# Patient Record
Sex: Female | Born: 1995 | Race: White | Hispanic: No | Marital: Married | Smoking: Former smoker
Health system: Southern US, Community
[De-identification: ages and names within clinical notes are randomized; demographics above are authoritative.]

## PROBLEM LIST (undated history)

## (undated) DIAGNOSIS — F419 Anxiety disorder, unspecified: Secondary | ICD-10-CM

## (undated) DIAGNOSIS — F99 Mental disorder, not otherwise specified: Secondary | ICD-10-CM

## (undated) DIAGNOSIS — J45909 Unspecified asthma, uncomplicated: Secondary | ICD-10-CM

## (undated) DIAGNOSIS — L309 Dermatitis, unspecified: Secondary | ICD-10-CM

## (undated) DIAGNOSIS — O009 Unspecified ectopic pregnancy without intrauterine pregnancy: Secondary | ICD-10-CM

## (undated) HISTORY — DX: Unspecified ectopic pregnancy without intrauterine pregnancy: O00.90

## (undated) HISTORY — DX: Unspecified asthma, uncomplicated: J45.909

## (undated) HISTORY — DX: Anxiety disorder, unspecified: F41.9

## (undated) HISTORY — PX: OTHER SURGICAL HISTORY: SHX169

## (undated) HISTORY — PX: DENTAL SURGERY: SHX609

## (undated) HISTORY — DX: Mental disorder, not otherwise specified: F99

---

## 2018-02-19 ENCOUNTER — Ambulatory Visit: Payer: Self-pay | Admitting: Physician Assistant

## 2018-02-19 ENCOUNTER — Encounter: Payer: Self-pay | Admitting: Physician Assistant

## 2018-02-19 VITALS — BP 111/65 | HR 84 | Temp 98.1°F | Ht 65.25 in | Wt 123.5 lb

## 2018-02-19 DIAGNOSIS — L02411 Cutaneous abscess of right axilla: Secondary | ICD-10-CM

## 2018-02-19 DIAGNOSIS — F419 Anxiety disorder, unspecified: Secondary | ICD-10-CM

## 2018-02-19 DIAGNOSIS — Z130 Encounter for screening for diseases of the blood and blood-forming organs and certain disorders involving the immune mechanism: Secondary | ICD-10-CM

## 2018-02-19 DIAGNOSIS — Z13220 Encounter for screening for lipoid disorders: Secondary | ICD-10-CM

## 2018-02-19 DIAGNOSIS — Z833 Family history of diabetes mellitus: Secondary | ICD-10-CM

## 2018-02-19 DIAGNOSIS — Z131 Encounter for screening for diabetes mellitus: Secondary | ICD-10-CM

## 2018-02-19 DIAGNOSIS — Z7689 Persons encountering health services in other specified circumstances: Secondary | ICD-10-CM

## 2018-02-19 NOTE — Progress Notes (Signed)
BP 111/65 (BP Location: Right Arm, Patient Position: Sitting, Cuff Size: Normal)   Pulse 84   Temp 98.1 F (36.7 C)   Ht 5' 5.25" (1.657 m)   Wt 123 lb 8 oz (56 kg)   SpO2 97%   BMI 20.39 kg/m    Subjective:    Patient ID: Sheila Griffith, female    DOB: [birth year], 22 y.o.   MRN: [id number]  HPI: Sheila Sealmanda Griffith is a 22 y.o. female presenting on 02/19/2018 for New Patient (Initial Visit) (previous patient at [facility] in [place] with Alisa Allwardt. pt was last seen there less than a year ago.) and Cyst (pt states she is currently taking antibiotic and anti-inflammatory [facility] for bumps on her R armpit. pt states bumps are much improved compared to how it was a week and a half ago. pt was told to start planning for surgery. pt has also been treating with peroxide which she states has helped.)   HPI  Chief Complaint  Patient presents with  . New Patient (Initial Visit)    previous patient at [facility] in [place] with Alisa Allwardt. pt was last seen there less than a year ago.  . Cyst    pt states she is currently taking antibiotic and anti-inflammatory [facility] for bumps on her R armpit. pt states bumps are much improved compared to how it was a week and a half ago. pt was told to start planning for surgery. pt has also been treating with peroxide which she states has helped.    Pt says she went to ER twice (at [facility] the first time they cut on the armpit and and prescribed antibiotics.  Her second trip they changed her antibiotics and did not cut again, but told her to prepare for surgery.  She does not know/remember what antibiotics she is taking now nor what she took the first time she went to the ER.   [facility] in about a [facility].   She says she had a PAP there.   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  Denies depression, SI, HI.   Relevant past  medical, surgical, family and social history reviewed and updated as indicated. Interim medical history since our last visit reviewed. Allergies and medications reviewed and updated.   Current Outpatient Medications:  Marland Kitchen.  UNKNOWN TO PATIENT, , Disp: , Rfl:  .  UNKNOWN TO PATIENT, , Disp: , Rfl:    Review of Systems  Constitutional: Positive for appetite change. Negative for chills, diaphoresis, fatigue, fever and unexpected weight change.  HENT: Negative for congestion, dental problem, drooling, ear pain, facial swelling, hearing loss, mouth sores, sneezing, sore throat, trouble swallowing and voice change.   Eyes: Negative for pain, discharge, redness, itching and visual disturbance.  Respiratory: Negative for cough, choking, shortness of breath and wheezing.   Cardiovascular: Negative for chest pain, palpitations and leg swelling.  Gastrointestinal: Negative for abdominal pain, blood in stool, constipation, diarrhea and vomiting.  Endocrine: Negative for cold intolerance, heat intolerance and polydipsia.  Genitourinary: Negative for decreased urine volume, dysuria and hematuria.  Musculoskeletal: Negative for arthralgias, back pain and gait problem.  Skin: Negative for rash.  Allergic/Immunologic: Positive for environmental allergies.  Neurological: Negative for seizures, syncope, light-headedness and headaches.  Hematological: Negative for adenopathy.  Psychiatric/Behavioral: Negative for agitation, dysphoric mood and suicidal ideas. The patient is nervous/anxious.  Per HPI unless specifically indicated above     Objective:    BP 111/65 (BP Location: Right Arm, Patient Position: Sitting, Cuff Size: Normal)   Pulse 84   Temp 98.1 F (36.7 C)   Ht 5' 5.25" (1.657 m)   Wt 123 lb 8 oz (56 kg)   SpO2 97%   BMI 20.39 kg/m   Wt Readings from Last 3 Encounters:  02/19/18 123 lb 8 oz (56 kg)    Physical Exam  Constitutional: She is oriented to person, place, and time. She  appears well-developed and well-nourished.  HENT:  Head: Normocephalic and atraumatic.  Mouth/Throat: Oropharynx is clear and moist. No oropharyngeal exudate.  Eyes: Pupils are equal, round, and reactive to light. Conjunctivae and EOM are normal.  Neck: Neck supple. No thyromegaly present.  Cardiovascular: Normal rate and regular rhythm.  Pulmonary/Chest: Effort normal and breath sounds normal.  Abdominal: Soft. Bowel sounds are normal. She exhibits no mass. There is no hepatosplenomegaly. There is no tenderness.  Musculoskeletal: She exhibits no edema.  Lymphadenopathy:    She has no cervical adenopathy.  Neurological: She is alert and oriented to person, place, and time. Gait normal.  Skin: Skin is warm and dry.  Very large abscess R axilla- covers almost entire surface are of the axilla.  No erythema.  Recent incisions healing.  The area is mildly tender.  The abscess is very nodular and lumpy.  There is no fluctuance needing I&D.   The L axilla is normal to exam  Psychiatric: She has a normal mood and affect. Her behavior is normal.  Vitals reviewed.   No results found for this or any previous visit.    Assessment & Plan:   Encounter Diagnoses  Name Primary?  . Encounter to establish care Yes  . Anxiety   . Abscess of right axilla   . Screening cholesterol level   . Screening for diabetes mellitus   . Family history of diabetes mellitus   . Screening, anemia, deficiency, iron     -Refer to surgeon for possible excision of abscess R axilla -Pt [facility] -Check baseline labs-  -recommended pt contact [facility] for counseling and assistance with her anxiety -pt will follow up in 1 month to review labs.  She will RTO sooner for any worsening of the axilla

## 2018-02-21 ENCOUNTER — Other Ambulatory Visit (HOSPITAL_COMMUNITY)
Admission: RE | Admit: 2018-02-21 | Discharge: 2018-02-21 | Disposition: A | Discharge status: Home / Self Care | Payer: Self-pay | Source: Ambulatory Visit | Attending: Physician Assistant | Admitting: Physician Assistant

## 2018-02-21 DIAGNOSIS — Z130 Encounter for screening for diseases of the blood and blood-forming organs and certain disorders involving the immune mechanism: Secondary | ICD-10-CM | POA: Insufficient documentation

## 2018-02-21 DIAGNOSIS — F419 Anxiety disorder, unspecified: Secondary | ICD-10-CM | POA: Insufficient documentation

## 2018-02-21 DIAGNOSIS — Z13220 Encounter for screening for lipoid disorders: Secondary | ICD-10-CM | POA: Insufficient documentation

## 2018-02-21 DIAGNOSIS — Z131 Encounter for screening for diabetes mellitus: Secondary | ICD-10-CM | POA: Insufficient documentation

## 2018-02-21 DIAGNOSIS — Z833 Family history of diabetes mellitus: Secondary | ICD-10-CM | POA: Insufficient documentation

## 2018-02-21 LAB — LIPID PANEL
CHOL/HDL RATIO: 3.5 ratio
Cholesterol: 145 mg/dL (ref 0–200)
HDL: 41 mg/dL (ref >40)
LDL CALC: 90 mg/dL (ref 0–99)
Triglycerides: 70 mg/dL (ref <150)
VLDL: 14 mg/dL (ref 0–40)

## 2018-02-21 LAB — COMPREHENSIVE METABOLIC PANEL
ALBUMIN: 4.1 g/dL (ref 3.5–5.0)
ALT: 13 U/L (ref 0–44)
AST: 18 U/L (ref 15–41)
Alkaline Phosphatase: 63 U/L (ref 38–126)
Anion gap: 8 (ref 5–15)
BUN: 7 mg/dL (ref 6–20)
CHLORIDE: 105 mmol/L (ref 98–111)
CO2: 27 mmol/L (ref 22–32)
Calcium: 9.7 mg/dL (ref 8.9–10.3)
Creatinine, Ser: 0.69 mg/dL (ref 0.44–1.00)
GFR calc Af Amer: >60 mL/min (ref >60)
GFR calc non Af Amer: >60 mL/min (ref >60)
GLUCOSE: 83 mg/dL (ref 70–99)
POTASSIUM: 4.1 mmol/L (ref 3.5–5.1)
Sodium: 140 mmol/L (ref 135–145)
Total Bilirubin: 0.8 mg/dL (ref 0.3–1.2)
Total Protein: 7.5 g/dL (ref 6.5–8.1)

## 2018-02-21 LAB — CBC
HCT: 41.1 % (ref 36.0–46.0)
Hemoglobin: 13.7 g/dL (ref 12.0–15.0)
MCH: 30.9 pg (ref 26.0–34.0)
MCHC: 33.3 g/dL (ref 30.0–36.0)
MCV: 92.6 fL (ref 78.0–100.0)
Platelets: 287 10*3/uL (ref 150–400)
RBC: 4.44 MIL/uL (ref 3.87–5.11)
RDW: 13.1 % (ref 11.5–15.5)
WBC: 6.1 10*3/uL (ref 4.0–10.5)

## 2018-02-21 LAB — TSH: TSH: 0.958 u[IU]/mL (ref 0.350–4.500)

## 2018-02-21 LAB — HEMOGLOBIN A1C
HEMOGLOBIN A1C: 5.2 % (ref 4.8–5.6)
MEAN PLASMA GLUCOSE: 102.54 mg/dL

## 2018-03-20 ENCOUNTER — Ambulatory Visit: Payer: Self-pay | Admitting: Physician Assistant

## 2018-03-20 ENCOUNTER — Encounter: Payer: Self-pay | Admitting: Physician Assistant

## 2018-03-20 VITALS — BP 98/58 | HR 73 | Temp 97.9°F | Ht 65.25 in | Wt 124.0 lb

## 2018-03-20 DIAGNOSIS — F419 Anxiety disorder, unspecified: Secondary | ICD-10-CM

## 2018-03-20 NOTE — Progress Notes (Signed)
BP (!) 98/58 (BP Location: Left Arm, Patient Position: Sitting, Cuff Size: Normal)   Pulse 73   Temp 97.9 F (36.6 C)   Ht 5' 5.25" (1.657 m)   Wt 124 lb (56.2 kg)   SpO2 99%   BMI 20.48 kg/m    Subjective:    Patient ID: Sheila Griffith, female    DOB: October 27, 1995, 22 y.o.   MRN: [id number]  HPI: Sheila Griffith is a 22 y.o. female presenting on 03/20/2018 for Follow-up   HPI   Pt says she has not heard from surgery but says she doesn't need it any more- her armpit cleared completely.     Pt says she still has anxiety at times.  She denies depression.  She is unaware of any stressors aggravating the anxiety.  Relevant past medical, surgical, family and social history reviewed and updated as indicated. Interim medical history since our last visit reviewed. Allergies and medications reviewed and updated.  No current outpatient medications on file.   Review of Systems  Constitutional: Negative for appetite change, chills, diaphoresis, fatigue, fever and unexpected weight change.  HENT: Positive for sneezing and sore throat. Negative for congestion, dental problem, drooling, ear pain, facial swelling, hearing loss, mouth sores, trouble swallowing and voice change.   Eyes: Positive for visual disturbance. Negative for pain, discharge, redness and itching.  Respiratory: Negative for cough, choking, shortness of breath and wheezing.   Cardiovascular: Negative for chest pain, palpitations and leg swelling.  Gastrointestinal: Negative for abdominal pain, blood in stool, constipation, diarrhea and vomiting.  Endocrine: Negative for cold intolerance, heat intolerance and polydipsia.  Genitourinary: Negative for decreased urine volume, dysuria and hematuria.  Musculoskeletal: Negative for arthralgias, back pain and gait problem.  Skin: Negative for rash.  Allergic/Immunologic: Positive for environmental allergies.  Neurological: Negative for seizures, syncope, light-headedness and  headaches.  Hematological: Negative for adenopathy.  Psychiatric/Behavioral: Negative for agitation, dysphoric mood and suicidal ideas. The patient is nervous/anxious.     Per HPI unless specifically indicated above     Objective:    BP (!) 98/58 (BP Location: Left Arm, Patient Position: Sitting, Cuff Size: Normal)   Pulse 73   Temp 97.9 F (36.6 C)   Ht 5' 5.25" (1.657 m)   Wt 124 lb (56.2 kg)   SpO2 99%   BMI 20.48 kg/m   Wt Readings from Last 3 Encounters:  03/20/18 124 lb (56.2 kg)  02/19/18 123 lb 8 oz (56 kg)    Physical Exam  Constitutional: She is oriented to person, place, and time. She appears well-developed and well-nourished.  HENT:  Head: Normocephalic and atraumatic.  Neck: Neck supple.  Cardiovascular: Normal rate and regular rhythm.  Pulmonary/Chest: Effort normal and breath sounds normal.  Abdominal: Soft. Bowel sounds are normal. She exhibits no mass. There is no hepatosplenomegaly. There is no tenderness.  Musculoskeletal: She exhibits no edema.  Lymphadenopathy:    She has no cervical adenopathy.  Neurological: She is alert and oriented to person, place, and time.  Skin: Skin is warm and dry.  Right axilla abscesses all resolved.  Healing scars present.  No redness.  No tenderness  Psychiatric: She has a normal mood and affect. Her behavior is normal.  Vitals reviewed.   Results for orders placed or performed during the hospital encounter of 02/21/18  Lipid panel  Result Value Ref Range   Cholesterol 145 0 - 200 mg/dL   Triglycerides 70 <782 mg/dL   HDL 41 >95 mg/dL   Total  CHOL/HDL Ratio 3.5 RATIO   VLDL 14 0 - 40 mg/dL   LDL Cholesterol 90 0 - 99 mg/dL  CBC  Result Value Ref Range   WBC 6.1 4.0 - 10.5 K/uL   RBC 4.44 3.87 - 5.11 MIL/uL   Hemoglobin 13.7 12.0 - 15.0 g/dL   HCT 09.4 70.9 - 62.8 %   MCV 92.6 78.0 - 100.0 fL   MCH 30.9 26.0 - 34.0 pg   MCHC 33.3 30.0 - 36.0 g/dL   RDW 36.6 29.4 - 76.5 %   Platelets 287 150 - 400 K/uL  TSH   Result Value Ref Range   TSH 0.958 0.350 - 4.500 uIU/mL  Hemoglobin A1c  Result Value Ref Range   Hgb A1c MFr Bld 5.2 4.8 - 5.6 %   Mean Plasma Glucose 102.54 mg/dL  Comprehensive metabolic panel  Result Value Ref Range   Sodium 140 135 - 145 mmol/L   Potassium 4.1 3.5 - 5.1 mmol/L   Chloride 105 98 - 111 mmol/L   CO2 27 22 - 32 mmol/L   Glucose, Bld 83 70 - 99 mg/dL   BUN 7 6 - 20 mg/dL   Creatinine, Ser 4.65 0.44 - 1.00 mg/dL   Calcium 9.7 8.9 - 03.5 mg/dL   Total Protein 7.5 6.5 - 8.1 g/dL   Albumin 4.1 3.5 - 5.0 g/dL   AST 18 15 - 41 U/L   ALT 13 0 - 44 U/L   Alkaline Phosphatase 63 38 - 126 U/L   Total Bilirubin 0.8 0.3 - 1.2 mg/dL   GFR calc non Af Amer >60 >60 mL/min   GFR calc Af Amer >60 >60 mL/min   Anion gap 8 5 - 15      Assessment & Plan:    Encounter Diagnosis  Name Primary?  Marland Kitchen Anxiety Yes    -reviewed labs with pt -discussed counseling with pt and gave card for [facility] to help with the anxiety.  If she decides at a later time that it is bothering her enough that she would like to try medication to help, she will return to office to discuss.  She says she does not want medication at the present time -pt to follow up in 1 year.  RTO sooner prn

## 2019-02-16 DIAGNOSIS — Z3A08 8 weeks gestation of pregnancy: Secondary | ICD-10-CM | POA: Diagnosis not present

## 2019-02-16 DIAGNOSIS — R110 Nausea: Secondary | ICD-10-CM | POA: Diagnosis not present

## 2019-02-16 DIAGNOSIS — R531 Weakness: Secondary | ICD-10-CM | POA: Diagnosis not present

## 2019-02-16 DIAGNOSIS — Z87891 Personal history of nicotine dependence: Secondary | ICD-10-CM | POA: Diagnosis not present

## 2019-02-16 DIAGNOSIS — O210 Mild hyperemesis gravidarum: Secondary | ICD-10-CM | POA: Diagnosis not present

## 2019-02-16 DIAGNOSIS — O9989 Other specified diseases and conditions complicating pregnancy, childbirth and the puerperium: Secondary | ICD-10-CM | POA: Diagnosis not present

## 2019-02-17 ENCOUNTER — Other Ambulatory Visit: Payer: Self-pay

## 2019-02-17 ENCOUNTER — Encounter: Payer: Self-pay | Admitting: Women's Health

## 2019-02-17 ENCOUNTER — Telehealth (INDEPENDENT_AMBULATORY_CARE_PROVIDER_SITE_OTHER): Payer: Self-pay | Admitting: Women's Health

## 2019-02-17 DIAGNOSIS — Z3201 Encounter for pregnancy test, result positive: Secondary | ICD-10-CM

## 2019-02-17 DIAGNOSIS — Z3491 Encounter for supervision of normal pregnancy, unspecified, first trimester: Secondary | ICD-10-CM

## 2019-02-17 NOTE — Progress Notes (Signed)
   [facility] VIRTUAL GYN VISIT ENCOUNTER NOTE Patient name: Sheila Griffith MRN [id number]  Date of birth: Dec 09, 1995  I connected with patient on 02/17/19 at 10:15 AM EDT by telephone (didn't want to download mychart right now)and verified that I am speaking with the correct person using two identifiers.  Due to COVID-19 recommendations, pt is not currently in the office.    I discussed the limitations, risks, security and privacy concerns of performing an evaluation and management service by telephone and the availability of in person appointments. I also discussed with the patient that there may be a patient responsible charge related to this service. The patient expressed understanding and agreed to proceed.   Chief Complaint:   Possible Pregnancy (2 pos upt at home)  History of Present Illness:   Sheila Griffith is a 23 y.o. [id number] Caucasian female being evaluated today for +HPT. Unsure LMP. Period tracker tells her she's 8wks. Got her to look through app while on phone, says LMP 5/19 which would make her [redacted]w[redacted]d, almost 4wks off from what period tracker is telling her she is. States there is no period documented in June. Taking pnv. Nausea, no vomiting. Declines meds. H/O 2 SABs 6wks & 4-5wks.  Went to [facility] ED yesterday b/c she felt very weak, was a little dehydrated.    No LMP recorded (lmp unknown). Patient is pregnant. 5/19 Review of Systems:   Pertinent items are noted in HPI Denies fever/chills, dizziness, headaches, visual disturbances, fatigue, shortness of breath, chest pain, abdominal pain, vomiting, abnormal vaginal discharge/itching/odor/irritation, problems with periods, bowel movements, urination, or intercourse unless otherwise stated above.  Pertinent History Reviewed:  Reviewed past medical,surgical, social, obstetrical and family history.  Reviewed problem list, medications and allergies. Physical Assessment:  There were no vitals filed for this visit.There is no height or  weight on file to calculate BMI.       Physical Examination:   General:  Alert, oriented and cooperative.   Mental Status: Normal mood and affect perceived. Normal judgment and thought content.  Physical exam deferred due to nature of the encounter  No results found for this or any previous visit (from the past 24 hour(s)).  Assessment & Plan:  1) Pregnant> unsure LMP, will get dating u/s at [facility], next here not available for 2wks  Meds: No orders of the defined types were placed in this encounter.   Orders Placed This Encounter  Procedures  . US OB Comp Less 14 Wks    I discussed the assessment and treatment plan with the patient. The patient was provided an opportunity to ask questions and all were answered. The patient agreed with the plan and demonstrated an understanding of the instructions.   The patient was advised to call back or seek an in-person evaluation/go to the ED if the symptoms worsen or if the condition fails to improve as anticipated.  I provided 15 minutes of non-face-to-face time during this encounter.   No follow-ups on file.  [place], [facility] 02/17/2019 11:22 AM

## 2019-02-19 ENCOUNTER — Telehealth: Payer: Self-pay | Admitting: *Deleted

## 2019-02-19 NOTE — Telephone Encounter (Signed)
Patient made aware U/S is scheduled for 8/14 @ [facility] at 2:30. She is to arrive at 2:15 with a full bladder.

## 2019-02-21 ENCOUNTER — Ambulatory Visit (HOSPITAL_COMMUNITY)
Admission: RE | Admit: 2019-02-21 | Discharge: 2019-02-21 | Disposition: A | Discharge status: Home / Self Care | Payer: Medicaid Other | Source: Ambulatory Visit | Attending: Women's Health | Admitting: Women's Health

## 2019-02-21 ENCOUNTER — Other Ambulatory Visit: Payer: Self-pay

## 2019-02-21 DIAGNOSIS — Z3491 Encounter for supervision of normal pregnancy, unspecified, first trimester: Secondary | ICD-10-CM | POA: Diagnosis not present

## 2019-03-19 ENCOUNTER — Ambulatory Visit: Payer: Self-pay | Admitting: Physician Assistant

## 2019-03-25 ENCOUNTER — Telehealth: Payer: Self-pay | Admitting: Women's Health

## 2019-03-25 NOTE — Telephone Encounter (Signed)
Called patient regarding appointment and the following message was left: ° ° °We have you scheduled for an upcoming appointment at our office. At this time, patients are encouraged to come alone to their visits whenever possible, however, a support person, over age 24, may accompany you to your appointment if assistance is needed for safety or care concerns. Otherwise, support persons should remain outside until the visit is complete.  ° °We ask if you have had any exposure to anyone suspected or confirmed of having COVID-19 or if you are experiencing any of the following, to call and reschedule your appointment: fever, cough, shortness of breath, muscle pain, diarrhea, rash, vomiting, abdominal pain, red eye, weakness, bruising, bleeding, joint pain, or a severe headache.  ° °Please know we will ask you these questions or similar questions when you arrive for your appointment and again it’s how we are keeping everyone safe.   ° °Also,to keep you safe, please use the provided hand sanitizer when you enter the office. We are asking everyone in the office to wear a mask to help prevent the spread of °germs. If you have a mask of your own, please wear it to your appointment, if not, we are happy to provide one for you. ° °Thank you for understanding and your cooperation.  ° ° °[facility] Staff ° ° ° ° ° °

## 2019-03-26 ENCOUNTER — Encounter: Payer: Self-pay | Admitting: Women's Health

## 2019-03-26 ENCOUNTER — Other Ambulatory Visit: Payer: Self-pay

## 2019-03-26 ENCOUNTER — Ambulatory Visit (INDEPENDENT_AMBULATORY_CARE_PROVIDER_SITE_OTHER): Payer: Medicaid Other | Admitting: Women's Health

## 2019-03-26 ENCOUNTER — Ambulatory Visit: Payer: Medicaid Other | Admitting: *Deleted

## 2019-03-26 VITALS — BP 123/83 | HR 85 | Wt 127.0 lb

## 2019-03-26 DIAGNOSIS — Z331 Pregnant state, incidental: Secondary | ICD-10-CM | POA: Diagnosis not present

## 2019-03-26 DIAGNOSIS — Z1379 Encounter for other screening for genetic and chromosomal anomalies: Secondary | ICD-10-CM | POA: Diagnosis not present

## 2019-03-26 DIAGNOSIS — Z3481 Encounter for supervision of other normal pregnancy, first trimester: Secondary | ICD-10-CM | POA: Diagnosis not present

## 2019-03-26 DIAGNOSIS — H01139 Eczematous dermatitis of unspecified eye, unspecified eyelid: Secondary | ICD-10-CM

## 2019-03-26 DIAGNOSIS — Z3A11 11 weeks gestation of pregnancy: Secondary | ICD-10-CM | POA: Diagnosis not present

## 2019-03-26 DIAGNOSIS — Z1389 Encounter for screening for other disorder: Secondary | ICD-10-CM

## 2019-03-26 DIAGNOSIS — Z3490 Encounter for supervision of normal pregnancy, unspecified, unspecified trimester: Secondary | ICD-10-CM | POA: Insufficient documentation

## 2019-03-26 DIAGNOSIS — Z3401 Encounter for supervision of normal first pregnancy, first trimester: Secondary | ICD-10-CM | POA: Diagnosis not present

## 2019-03-26 DIAGNOSIS — Z363 Encounter for antenatal screening for malformations: Secondary | ICD-10-CM

## 2019-03-26 LAB — POCT URINALYSIS DIPSTICK OB
Blood, UA: NEGATIVE
Glucose, UA: NEGATIVE
Ketones, UA: NEGATIVE
Leukocytes, UA: NEGATIVE
Nitrite, UA: NEGATIVE
POC,PROTEIN,UA: NEGATIVE

## 2019-03-26 MED ORDER — HYDROCORTISONE 2 % EX LOTN: TOPICAL_LOTION | CUTANEOUS | 0 refills | Status: DC

## 2019-03-26 MED ORDER — BLOOD PRESSURE MONITOR MISC: 0 refills | Status: DC

## 2019-03-26 NOTE — Patient Instructions (Signed)
Sheila Griffith, I greatly value your feedback.  If you receive a survey following your visit with [place] today, we appreciate you taking the time to fill it out.  Thanks, Sheila Griffith, CNM, [facility]  [place]!!! It is now [facility] ([place], [place] [address]) Entrance located off of [address] parking   Nausea & Vomiting  Have saltine crackers or pretzels by your bed and eat a few bites before you raise your head out of bed in the morning  Eat small frequent meals throughout the day instead of large meals  Drink plenty of fluids throughout the day to stay hydrated, just don't drink a lot of fluids with your meals.  This can make your stomach fill up faster making you feel sick  Do not brush your teeth right after you eat  Products with real ginger are good for nausea, like ginger ale and ginger hard candy Make sure it says made with real ginger!  Sucking on sour candy like lemon heads is also good for nausea  If your prenatal vitamins make you nauseated, take them at night so you will sleep through the nausea  Sea Bands  If you feel like you need medicine for the nausea & vomiting please let us know  If you are unable to keep any fluids or food down please let us know   Constipation  Drink plenty of fluid, preferably water, throughout the day  Eat foods high in fiber such as fruits, vegetables, and grains  Exercise, such as walking, is a good way to keep your bowels regular  Drink warm fluids, especially warm prune juice, or decaf coffee  Eat a 1/2 cup of real oatmeal (not instant), 1/2 cup applesauce, and 1/2-1 cup warm prune juice every day  If needed, you may take Colace (docusate sodium) stool softener once or twice a day to help keep the stool soft.   If you still are having problems with constipation, you may take Miralax once daily as needed to help keep your bowels regular.   Home Blood  Pressure Monitoring for Patients   Your provider has recommended that you check your blood pressure (BP) at least once a week at home. If you do not have a blood pressure cuff at home, one will be provided for you. Contact your provider if you have not received your monitor within 1 week.   Helpful Tips for Accurate Home Blood Pressure Checks  . Don't smoke, exercise, or drink caffeine 30 minutes before checking your BP . Use the restroom before checking your BP (a full bladder can raise your pressure) . Relax in a comfortable upright chair . Feet on the ground . Left arm resting comfortably on a flat surface at the level of your heart . Legs uncrossed . Back supported . Sit quietly and don't talk . Place the cuff on your bare arm . Adjust snuggly, so that only two fingertips can fit between your skin and the top of the cuff . Check 2 readings separated by at least one minute . Keep a log of your BP readings . For a visual, please reference this diagram: http://ccnc.care/bpdiagram  Provider Name: [facility]     Phone: [phone]  Zone 1: ALL CLEAR  Continue to monitor your symptoms:  . BP reading is less than 140 (top number) or less than 90 (bottom number)  . No right upper stomach pain .  No headaches or seeing spots . No feeling nauseated or throwing up . No swelling in face and hands  Zone 2: CAUTION Call your doctor's office for any of the following:  . BP reading is greater than 140 (top number) or greater than 90 (bottom number)  . Stomach pain under your ribs in the middle or right side . Headaches or seeing spots . Feeling nauseated or throwing up . Swelling in face and hands  Zone 3: EMERGENCY  Seek immediate medical care if you have any of the following:  . BP reading is greater than160 (top number) or greater than 110 (bottom number) . Severe headaches not improving with Tylenol . Serious difficulty catching your breath . Any worsening symptoms from Zone  2    First Trimester of Pregnancy The first trimester of pregnancy is from week 1 until the end of week 12 (months 1 through 3). A week after a sperm fertilizes an egg, the egg will implant on the wall of the uterus. This embryo will begin to develop into a baby. Genes from you and your partner are forming the baby. The female genes determine whether the baby is a boy or a girl. At 6-8 weeks, the eyes and face are formed, and the heartbeat can be seen on ultrasound. At the end of 12 weeks, all the baby's organs are formed.  Now that you are pregnant, you will want to do everything you can to have a healthy baby. Two of the most important things are to get good prenatal care and to follow your health care provider's instructions. Prenatal care is all the medical care you receive before the baby's birth. This care will help prevent, find, and treat any problems during the pregnancy and childbirth. BODY CHANGES Your body goes through many changes during pregnancy. The changes vary from woman to woman.   You may gain or lose a couple of pounds at first.  You may feel sick to your stomach (nauseous) and throw up (vomit). If the vomiting is uncontrollable, call your health care provider.  You may tire easily.  You may develop headaches that can be relieved by medicines approved by your health care provider.  You may urinate more often. Painful urination may mean you have a bladder infection.  You may develop heartburn as a result of your pregnancy.  You may develop constipation because certain hormones are causing the muscles that push waste through your intestines to slow down.  You may develop hemorrhoids or swollen, bulging veins (varicose veins).  Your breasts may begin to grow larger and become tender. Your nipples may stick out more, and the tissue that surrounds them (areola) may become darker.  Your gums may bleed and may be sensitive to brushing and flossing.  Dark spots or blotches  (chloasma, mask of pregnancy) may develop on your face. This will likely fade after the baby is born.  Your menstrual periods will stop.  You may have a loss of appetite.  You may develop cravings for certain kinds of food.  You may have changes in your emotions from day to day, such as being excited to be pregnant or being concerned that something may go wrong with the pregnancy and baby.  You may have more vivid and strange dreams.  You may have changes in your hair. These can include thickening of your hair, rapid growth, and changes in texture. Some women also have hair loss during or after pregnancy, or hair that feels dry  or thin. Your hair will most likely return to normal after your baby is born. WHAT TO EXPECT AT YOUR PRENATAL VISITS During a routine prenatal visit:  You will be weighed to make sure you and the baby are growing normally.  Your blood pressure will be taken.  Your abdomen will be measured to track your baby's growth.  The fetal heartbeat will be listened to starting around week 10 or 12 of your pregnancy.  Test results from any previous visits will be discussed. Your health care provider may ask you:  How you are feeling.  If you are feeling the baby move.  If you have had any abnormal symptoms, such as leaking fluid, bleeding, severe headaches, or abdominal cramping.  If you have any questions. Other tests that may be performed during your first trimester include:  Blood tests to find your blood type and to check for the presence of any previous infections. They will also be used to check for low iron levels (anemia) and Rh antibodies. Later in the pregnancy, blood tests for diabetes will be done along with other tests if problems develop.  Urine tests to check for infections, diabetes, or protein in the urine.  An ultrasound to confirm the proper growth and development of the baby.  An amniocentesis to check for possible genetic problems.  Fetal  screens for spina bifida and Down syndrome.  You may need other tests to make sure you and the baby are doing well. HOME CARE INSTRUCTIONS  Medicines  Follow your health care provider's instructions regarding medicine use. Specific medicines may be either safe or unsafe to take during pregnancy.  Take your prenatal vitamins as directed.  If you develop constipation, try taking a stool softener if your health care provider approves. Diet  Eat regular, well-balanced meals. Choose a variety of foods, such as meat or vegetable-based protein, fish, milk and low-fat dairy products, vegetables, fruits, and whole grain breads and cereals. Your health care provider will help you determine the amount of weight gain that is right for you.  Avoid raw meat and uncooked cheese. These carry germs that can cause birth defects in the baby.  Eating four or five small meals rather than three large meals a day may help relieve nausea and vomiting. If you start to feel nauseous, eating a few soda crackers can be helpful. Drinking liquids between meals instead of during meals also seems to help nausea and vomiting.  If you develop constipation, eat more high-fiber foods, such as fresh vegetables or fruit and whole grains. Drink enough fluids to keep your urine clear or pale yellow. Activity and Exercise  Exercise only as directed by your health care provider. Exercising will help you:  Control your weight.  Stay in shape.  Be prepared for labor and delivery.  Experiencing pain or cramping in the lower abdomen or low back is a good sign that you should stop exercising. Check with your health care provider before continuing normal exercises.  Try to avoid standing for long periods of time. Move your legs often if you must stand in one place for a long time.  Avoid heavy lifting.  Wear low-heeled shoes, and practice good posture.  You may continue to have sex unless your health care provider directs you  otherwise. Relief of Pain or Discomfort  Wear a good support bra for breast tenderness.    Take warm sitz baths to soothe any pain or discomfort caused by hemorrhoids. Use hemorrhoid cream if your  health care provider approves.    Rest with your legs elevated if you have leg cramps or low back pain.  If you develop varicose veins in your legs, wear support hose. Elevate your feet for 15 minutes, 3-4 times a day. Limit salt in your diet. Prenatal Care  Schedule your prenatal visits by the twelfth week of pregnancy. They are usually scheduled monthly at first, then more often in the last 2 months before delivery.  Write down your questions. Take them to your prenatal visits.  Keep all your prenatal visits as directed by your health care provider. Safety  Wear your seat belt at all times when driving.  Make a list of emergency phone numbers, including numbers for family, friends, the hospital, and police and fire departments. General Tips  Ask your health care provider for a referral to a local prenatal education class. Begin classes no later than at the beginning of month 6 of your pregnancy.  Ask for help if you have counseling or nutritional needs during pregnancy. Your health care provider can offer advice or refer you to specialists for help with various needs.  Do not use hot tubs, steam rooms, or saunas.  Do not douche or use tampons or scented sanitary pads.  Do not cross your legs for long periods of time.  Avoid cat litter boxes and soil used by cats. These carry germs that can cause birth defects in the baby and possibly loss of the fetus by miscarriage or stillbirth.  Avoid all smoking, herbs, alcohol, and medicines not prescribed by your health care provider. Chemicals in these affect the formation and growth of the baby.  Schedule a dentist appointment. At home, brush your teeth with a soft toothbrush and be gentle when you floss. SEEK MEDICAL CARE IF:   You have  dizziness.  You have mild pelvic cramps, pelvic pressure, or nagging pain in the abdominal area.  You have persistent nausea, vomiting, or diarrhea.  You have a bad smelling vaginal discharge.  You have pain with urination.  You notice increased swelling in your face, hands, legs, or ankles. SEEK IMMEDIATE MEDICAL CARE IF:   You have a fever.  You are leaking fluid from your vagina.  You have spotting or bleeding from your vagina.  You have severe abdominal cramping or pain.  You have rapid weight gain or loss.  You vomit blood or material that looks like coffee grounds.  You are exposed to [place] measles and have never had them.  You are exposed to fifth disease or chickenpox.  You develop a severe headache.  You have shortness of breath.  You have any kind of trauma, such as from a fall or a car accident. Document Released: 06/20/2001 Document Revised: 11/10/2013 Document Reviewed: 05/06/2013 [facility] Patient Information 2015 [place], [place]. This information is not intended to replace advice given to you by your health care provider. Make sure you discuss any questions you have with your health care provider.  Coronavirus (COVID-19) Are you at risk?  Are you at risk for the Coronavirus (COVID-19)?  To be considered HIGH RISK for Coronavirus (COVID-19), you have to meet the following criteria:  . Traveled to [place], [place], [place], [place] or [place]; or in the [place] to [place], [place], [place], or [place]; and have fever, cough, and shortness of breath within the last 2 weeks of travel OR . Been in close contact with a person diagnosed with COVID-19 within the last 2 weeks and  have fever, cough, and shortness of breath . IF YOU DO NOT MEET THESE CRITERIA, YOU ARE CONSIDERED LOW RISK FOR COVID-19.  What to do if you are HIGH RISK for COVID-19?  Marland Kitchen If you are having a medical emergency, call 911. . Seek medical care right away. Before you go to a  doctor's office, urgent care or emergency department, call ahead and tell them about your recent travel, contact with someone diagnosed with COVID-19, and your symptoms. You should receive instructions from your physician's office regarding next steps of care.  . When you arrive at healthcare provider, tell the healthcare staff immediately you have returned from visiting [place], [place], [place], [place] or [place]; or traveled in the [place] to [place], [place], [place], or [place]; in the last two weeks or you have been in close contact with a person diagnosed with COVID-19 in the last 2 weeks.   . Tell the health care staff about your symptoms: fever, cough and shortness of breath. . After you have been seen by a medical provider, you will be either: o Tested for (COVID-19) and discharged home on quarantine except to seek medical care if symptoms worsen, and asked to  - Stay home and avoid contact with others until you get your results (4-5 days)  - Avoid travel on public transportation if possible (such as bus, train, or airplane) or o Sent to the Emergency Department by EMS for evaluation, COVID-19 testing, and possible admission depending on your condition and test results.  What to do if you are LOW RISK for COVID-19?  Reduce your risk of any infection by using the same precautions used for avoiding the common cold or flu:  Marland Kitchen Wash your hands often with soap and warm water for at least 20 seconds.  If soap and water are not readily available, use an alcohol-based hand sanitizer with at least 60% alcohol.  . If coughing or sneezing, cover your mouth and nose by coughing or sneezing into the elbow areas of your shirt or coat, into a tissue or into your sleeve (not your hands). . Avoid shaking hands with others and consider head nods or verbal greetings only. . Avoid touching your eyes, nose, or mouth with unwashed hands.  . Avoid close contact with people who are sick. . Avoid  places or events with large numbers of people in one location, like concerts or sporting events. . Carefully consider travel plans you have or are making. . If you are planning any travel outside or inside the [place], visit the CDC's Travelers' Health webpage for the latest health notices. . If you have some symptoms but not all symptoms, continue to monitor at home and seek medical attention if your symptoms worsen. . If you are having a medical emergency, call 911.   [place] / e-Visit: [email or web address]         [facility]: [facility]: [id number]                   [facility]: [phone]

## 2019-03-26 NOTE — Progress Notes (Signed)
INITIAL OBSTETRICAL VISIT Patient name: Sheila Griffith MRN [id number]  Date of birth: [birth year] Chief Complaint:   Initial Prenatal Visit  History of Present Illness:   Sheila Griffith is a 23 y.o. 453P0020 Caucasian female at 2559w5d by 7wk u/s, with an Estimated Date of Delivery: 10/10/19 being seen today for her initial obstetrical visit.   Her obstetrical history is significant for SAB x 2.   Today she reports bad periorbital eczema that started w/ pregnancy, has tried multiple otc creams w/o relief Anx- no meds, doing well No LMP recorded (lmp unknown). Patient is pregnant. Last pap last year [facility]. Results were: normal Review of Systems:   Pertinent items are noted in HPI Denies cramping/contractions, leakage of fluid, vaginal bleeding, abnormal vaginal discharge w/ itching/odor/irritation, headaches, visual changes, shortness of breath, chest pain, abdominal pain, severe nausea/vomiting, or problems with urination or bowel movements unless otherwise stated above.  Pertinent History Reviewed:  Reviewed past medical,surgical, social, obstetrical and family history.  Reviewed problem list, medications and allergies. OB History  Gravida Para Term Preterm AB Living  3       2    SAB TAB Ectopic Multiple Live Births  2            # Outcome Date GA Lbr Len/2nd Weight Sex Delivery Anes PTL Lv  3 Current           2 SAB 2018 268w0d         1 SAB 2016 8368w0d          Physical Assessment:   Vitals:   03/26/19 1007  BP: 123/83  Pulse: 85  Weight: 127 lb (57.6 kg)  Body mass index is 20.97 kg/m.       Physical Examination:  General appearance - well appearing, and in no distress  Mental status - alert, oriented to person, place, and time  Psych:  She has a normal mood and affect  Skin - warm and dry, normal color. Periorbital area very erythematous, flaky c/w eczema  Chest - effort normal, all lung fields clear to auscultation bilaterally  Heart - normal rate and regular rhythm  Abdomen - soft, nontender  Extremities:  No swelling or varicosities noted  Thin prep pap is not done   Informal transabdominal u/s: +FCA, active fetus  Results for orders placed or performed in visit on 03/26/19 (from the past 24 hour(s))  POC Urinalysis Dipstick OB   Collection Time: 03/26/19 10:27 AM  Result Value Ref Range   Color, UA     Clarity, UA     Glucose, UA Negative Negative   Bilirubin, UA     Ketones, UA neg    Spec Grav, UA     Blood, UA neg    pH, UA     POC,PROTEIN,UA Negative Negative, Trace, Small (1+), Moderate (2+), Large (3+), 4+   Urobilinogen, UA     Nitrite, UA neg    Leukocytes, UA Negative Negative   Appearance     Odor      Assessment & Plan:  1) Low-Risk Pregnancy G3P0020 at 4359w5d with an Estimated Date of Delivery: 10/10/19   2) Initial OB visit  3) Periorbital eczema> rx hydrocortisone 2% bid prn  Meds:  Meds ordered this encounter  Medications  . Blood Pressure Monitor MISC    Sig: For regular home bp monitoring during pregnancy    Dispense:  1 each    Refill:  0    Z34.90  . HYDROCORTISONE, TOPICAL,  2 % LOTN    Sig: Apply small amt around eyes BID prn    Dispense:  29.6 mL    Refill:  0    Order Specific Question:   Supervising Provider    Answer:   Tania Ade H [[id number]]    Initial labs obtained Continue prenatal vitamins Reviewed n/v relief measures and warning s/s to report Reviewed recommended weight gain based on pre-gravid BMI Encouraged well-balanced diet Genetic Screening discussed: requested maternit21 only Cystic fibrosis, SMA, Fragile X screening discussed declined Ultrasound discussed; fetal survey: requested CCNC completed>PCM not here, form faxed Does not have home bp cuff. Rx faxed to [facility]. Check bp weekly, let us know if >140/90.   Follow-up: Return in about 6 weeks (around 05/09/2019) for [place], TG:GYIRSWN, in person.   Orders Placed This Encounter  Procedures  . Urine Culture  . GC/Chlamydia Probe Amp  .  US OB Comp + 14 Wk  . Obstetric Panel, Including HIV  . Sickle cell screen  . Urinalysis, Routine w reflex microscopic  . Pain Management Screening Profile (10S)  . MaterniT 21 plus Core, Blood  . POC Urinalysis Dipstick OB    Roma Schanz CNM, [facility] 03/26/2019 11:31 AM

## 2019-03-28 LAB — PMP SCREEN PROFILE (10S), URINE
Amphetamine Scrn, Ur: NEGATIVE ng/mL
BARBITURATE SCREEN URINE: NEGATIVE ng/mL
BENZODIAZEPINE SCREEN, URINE: NEGATIVE ng/mL
CANNABINOIDS UR QL SCN: NEGATIVE ng/mL
Cocaine (Metab) Scrn, Ur: NEGATIVE ng/mL
Creatinine(Crt), U: 229.7 mg/dL (ref 20.0–300.0)
Methadone Screen, Urine: NEGATIVE ng/mL
OXYCODONE+OXYMORPHONE UR QL SCN: NEGATIVE ng/mL
Opiate Scrn, Ur: NEGATIVE ng/mL
Ph of Urine: 7 (ref 4.5–8.9)
Phencyclidine Qn, Ur: NEGATIVE ng/mL
Propoxyphene Scrn, Ur: NEGATIVE ng/mL

## 2019-03-28 LAB — GC/CHLAMYDIA PROBE AMP
Chlamydia trachomatis, NAA: NEGATIVE
Neisseria Gonorrhoeae by PCR: NEGATIVE

## 2019-03-28 LAB — URINE CULTURE

## 2019-03-30 LAB — MATERNIT 21 PLUS CORE, BLOOD
Fetal Fraction: 7
Result (T21): NEGATIVE
Trisomy 13 (Patau syndrome): NEGATIVE
Trisomy 18 (Edwards syndrome): NEGATIVE
Trisomy 21 (Down syndrome): NEGATIVE

## 2019-04-01 LAB — URINALYSIS, ROUTINE W REFLEX MICROSCOPIC
Bilirubin, UA: NEGATIVE
Glucose, UA: NEGATIVE
Ketones, UA: NEGATIVE
Leukocytes,UA: NEGATIVE
Nitrite, UA: NEGATIVE
RBC, UA: NEGATIVE
Specific Gravity, UA: 1.026 (ref 1.005–1.030)
Urobilinogen, Ur: 0.2 mg/dL (ref 0.2–1.0)
pH, UA: 7 (ref 5.0–7.5)

## 2019-04-01 LAB — OBSTETRIC PANEL, INCLUDING HIV
Antibody Screen: NEGATIVE
Basophils Absolute: 0 10*3/uL (ref 0.0–0.2)
Basos: 0 %
EOS (ABSOLUTE): 0.5 10*3/uL — ABNORMAL HIGH (ref 0.0–0.4)
Eos: 4 %
HIV Screen 4th Generation wRfx: NONREACTIVE
Hematocrit: 42.8 % (ref 34.0–46.6)
Hemoglobin: 14.6 g/dL (ref 11.1–15.9)
Hepatitis B Surface Ag: NEGATIVE
Immature Grans (Abs): 0 10*3/uL (ref 0.0–0.1)
Immature Granulocytes: 0 %
Lymphocytes Absolute: 1.9 10*3/uL (ref 0.7–3.1)
Lymphs: 14 %
MCH: 30.3 pg (ref 26.6–33.0)
MCHC: 34.1 g/dL (ref 31.5–35.7)
MCV: 89 fL (ref 79–97)
Monocytes Absolute: 0.9 10*3/uL (ref 0.1–0.9)
Monocytes: 7 %
Neutrophils Absolute: 10.2 10*3/uL — ABNORMAL HIGH (ref 1.4–7.0)
Neutrophils: 75 %
Platelets: 239 10*3/uL (ref 150–450)
RBC: 4.82 x10E6/uL (ref 3.77–5.28)
RDW: 12.8 % (ref 11.7–15.4)
RPR Ser Ql: NONREACTIVE
Rh Factor: POSITIVE
Rubella Antibodies, IGG: 5.4 index (ref >0.99)
WBC: 13.6 10*3/uL — ABNORMAL HIGH (ref 3.4–10.8)

## 2019-04-01 LAB — SICKLE CELL SCREEN: Sickle Cell Screen: NEGATIVE

## 2019-04-03 DIAGNOSIS — Z3401 Encounter for supervision of normal first pregnancy, first trimester: Secondary | ICD-10-CM | POA: Diagnosis not present

## 2019-04-24 ENCOUNTER — Emergency Department (HOSPITAL_COMMUNITY)
Admission: EM | Admit: 2019-04-24 | Discharge: 2019-04-24 | Disposition: A | Discharge status: Home / Self Care | Payer: Medicaid Other | Attending: Emergency Medicine | Admitting: Emergency Medicine

## 2019-04-24 ENCOUNTER — Other Ambulatory Visit: Payer: Self-pay

## 2019-04-24 DIAGNOSIS — Z20828 Contact with and (suspected) exposure to other viral communicable diseases: Secondary | ICD-10-CM | POA: Diagnosis not present

## 2019-04-24 DIAGNOSIS — Z3A15 15 weeks gestation of pregnancy: Secondary | ICD-10-CM | POA: Diagnosis not present

## 2019-04-24 DIAGNOSIS — J4541 Moderate persistent asthma with (acute) exacerbation: Secondary | ICD-10-CM | POA: Diagnosis not present

## 2019-04-24 DIAGNOSIS — Z87891 Personal history of nicotine dependence: Secondary | ICD-10-CM | POA: Insufficient documentation

## 2019-04-24 DIAGNOSIS — O99512 Diseases of the respiratory system complicating pregnancy, second trimester: Secondary | ICD-10-CM | POA: Diagnosis not present

## 2019-04-24 DIAGNOSIS — Z79899 Other long term (current) drug therapy: Secondary | ICD-10-CM | POA: Insufficient documentation

## 2019-04-24 DIAGNOSIS — R0602 Shortness of breath: Secondary | ICD-10-CM | POA: Diagnosis present

## 2019-04-24 MED ORDER — PREDNISONE 10 MG PO TABS
60.0000 mg | ORAL_TABLET | Freq: Once | ORAL | Status: AC
  Administered 2019-04-24: 60 mg via ORAL
  Filled 2019-04-24: qty 1

## 2019-04-24 MED ORDER — ALBUTEROL SULFATE HFA 108 (90 BASE) MCG/ACT IN AERS: 1.0000 | INHALATION_SPRAY | Freq: Four times a day (QID) | RESPIRATORY_TRACT | 1 refills | Status: DC | PRN

## 2019-04-24 MED ORDER — PREDNISONE 50 MG PO TABS: ORAL_TABLET | ORAL | 0 refills | Status: DC

## 2019-04-24 MED ORDER — ALBUTEROL SULFATE HFA 108 (90 BASE) MCG/ACT IN AERS
2.0000 | INHALATION_SPRAY | Freq: Once | RESPIRATORY_TRACT | Status: AC
  Administered 2019-04-24: 2 via RESPIRATORY_TRACT
  Filled 2019-04-24: qty 6.7

## 2019-04-24 NOTE — ED Provider Notes (Signed)
[facility] EMERGENCY DEPARTMENT Provider Note   CSN: [id number] Arrival date & time: 04/24/19  0414     History   Chief Complaint Chief Complaint  Patient presents with  . Shortness of Breath    HPI Sheila Griffith is a 23 y.o. female.     The history is provided by the patient.  Shortness of Breath Severity:  Moderate Onset quality:  Gradual Duration:  1 day Timing:  Intermittent Progression:  Worsening Chronicity:  New Relieved by:  Nothing Worsened by:  Nothing Ineffective treatments: Albuterol. Associated symptoms: cough and wheezing   Associated symptoms: no abdominal pain, no chest pain, no fever, no hemoptysis, no sputum production and no vomiting   Risk factors: no tobacco use   Patient with history of asthma and currently [redacted] weeks pregnant presents with shortness of breath and wheezing.  She reports over the past day she had a scratchy throat and scratchy ears.  Then she began having nonproductive cough and wheezing.  She has tried albuterol at home without relief. She reports history of asthma as a child but has not had any recent exacerbations.  She denies any recent complications from pregnancy.  Denies abdominal cramping or vaginal bleeding Past Medical History:  Diagnosis Date  . Anxiety   . Asthma    childhood    Patient Active Problem List   Diagnosis Date Noted  . Supervision of normal pregnancy 03/26/2019    Past Surgical History:  Procedure Laterality Date  . DENTAL SURGERY       OB History    Gravida  3   Para      Term      Preterm      AB  2   Living        SAB  2   TAB      Ectopic      Multiple      Live Births               Home Medications    Prior to Admission medications   Medication Sig Start Date End Date Taking? Authorizing Provider  Blood Pressure Monitor MISC For regular home bp monitoring during pregnancy 03/26/19   Cheral Marker, CNM  HYDROCORTISONE, TOPICAL, 2 % LOTN Apply small amt  around eyes BID prn 03/26/19   Cheral Marker, CNM  Prenatal Vit-Fe Fumarate-FA (PRENATAL VITAMIN PO) Take by mouth.    [provider]    Family History Family History  Problem Relation Age of Onset  . Depression Mother   . Anxiety disorder Mother   . Arthritis Mother   . Diabetes Father   . Stroke Father   . Heart disease Father   . Depression Father   . Anxiety disorder Father     Social History Social History   Tobacco Use  . Smoking status: Former Smoker    Types: Cigarettes    Quit date: 07/10/2014    Years since quitting: 4.7  . Smokeless tobacco: Never Used  . Tobacco comment: previously social smoker  Substance Use Topics  . Alcohol use: Never    Frequency: Never  . Drug use: Not Currently    Types: Marijuana    Comment: none since 2017     Allergies   Peanut-containing drug products   Review of Systems Review of Systems  Constitutional: Negative for fever.  Respiratory: Positive for cough, shortness of breath and wheezing. Negative for hemoptysis and sputum production.   Cardiovascular: Negative for  chest pain.  Gastrointestinal: Negative for abdominal pain and vomiting.  Genitourinary: Negative for vaginal bleeding.  All other systems reviewed and are negative.    Physical Exam Updated Vital Signs BP 121/76   Pulse (!) 104   Temp 98.7 F (37.1 C) (Oral)   Resp 11   Ht 1.702 m (5\' 7" )   Wt 59 kg   LMP  (LMP Unknown)   SpO2 95%   BMI 20.36 kg/m   Physical Exam CONSTITUTIONAL: Well developed/well nourished HEAD: Normocephalic/atraumatic EYES: EOMI ENMT: Mucous membranes moist NECK: supple no meningeal signs SPINE/BACK:entire spine nontender CV: S1/S2 noted, no murmurs/rubs/gallops noted LUNGS: Wheezing bilaterally, mild tachypnea ABDOMEN: soft, nontender, no rebound or guarding, bowel sounds noted throughout abdomen GU:no cva tenderness NEURO: Pt is awake/alert/appropriate, moves all extremitiesx4.  No facial droop.    EXTREMITIES: pulses normal/equal, full ROM SKIN: warm, color normal PSYCH: no abnormalities of mood noted, alert and oriented to situation   ED Treatments / Results  Labs (all labs ordered are listed, but only abnormal results are displayed) Labs Reviewed - No data to display  EKG None  Radiology No results found.  Procedures Procedures  Medications Ordered in ED Medications  predniSONE (DELTASONE) tablet 60 mg (60 mg Oral Given 04/24/19 0624)  albuterol (VENTOLIN HFA) 108 (90 Base) MCG/ACT inhaler 2 puff (2 puffs Inhalation Given 04/24/19 6578)     Initial Impression / Assessment and Plan / ED Course  I have reviewed the triage vital signs and the nursing notes.         Patient history of asthma presents with wheezing and coughing.  No crackles on exam, she is afebrile.  Will give albuterol prednisone and reassess, I feel the benefit of use of these medications will outweigh any risks during pregnancy.  Denies any recent pregnancy complications.  She declines COVID testing 6:46 AM Patient feels much improved.  Work of breathing is improved and wheezing has nearly resolved.  She has responded well to prednisone and albuterol.  This will be prescribed on a short-term basis. No signs of pneumonia on exam.  Patient continues to decline COVID-19 testing   Final Clinical Impressions(s) / ED Diagnoses   Final diagnoses:  Moderate persistent asthma with exacerbation    ED Discharge Orders         Ordered    predniSONE (DELTASONE) 50 MG tablet     04/24/19 0641    albuterol (VENTOLIN HFA) 108 (90 Base) MCG/ACT inhaler  Every 6 hours PRN     04/24/19 0641           Ripley Fraise, MD 04/24/19 [phone]

## 2019-04-24 NOTE — ED Triage Notes (Signed)
Pt states she started having SOB a day and a half ago and has progressively gotten worse. Denies fever.

## 2019-05-09 ENCOUNTER — Ambulatory Visit (INDEPENDENT_AMBULATORY_CARE_PROVIDER_SITE_OTHER): Payer: Medicaid Other

## 2019-05-09 ENCOUNTER — Ambulatory Visit (INDEPENDENT_AMBULATORY_CARE_PROVIDER_SITE_OTHER): Payer: Medicaid Other | Admitting: Women's Health

## 2019-05-09 ENCOUNTER — Encounter: Payer: Self-pay | Admitting: Women's Health

## 2019-05-09 ENCOUNTER — Other Ambulatory Visit: Payer: Self-pay

## 2019-05-09 VITALS — BP 106/68 | HR 83 | Wt 128.0 lb

## 2019-05-09 DIAGNOSIS — Z1379 Encounter for other screening for genetic and chromosomal anomalies: Secondary | ICD-10-CM

## 2019-05-09 DIAGNOSIS — Z3482 Encounter for supervision of other normal pregnancy, second trimester: Secondary | ICD-10-CM

## 2019-05-09 DIAGNOSIS — Z331 Pregnant state, incidental: Secondary | ICD-10-CM

## 2019-05-09 DIAGNOSIS — Z363 Encounter for antenatal screening for malformations: Secondary | ICD-10-CM

## 2019-05-09 DIAGNOSIS — Z1389 Encounter for screening for other disorder: Secondary | ICD-10-CM

## 2019-05-09 DIAGNOSIS — Z3A18 18 weeks gestation of pregnancy: Secondary | ICD-10-CM

## 2019-05-09 DIAGNOSIS — Z3481 Encounter for supervision of other normal pregnancy, first trimester: Secondary | ICD-10-CM

## 2019-05-09 LAB — POCT URINALYSIS DIPSTICK OB
Blood, UA: NEGATIVE
Glucose, UA: NEGATIVE
Ketones, UA: NEGATIVE
Leukocytes, UA: NEGATIVE
Nitrite, UA: NEGATIVE
POC,PROTEIN,UA: NEGATIVE

## 2019-05-09 NOTE — Progress Notes (Signed)
[place] 18 wks,transverse head right,normal ovaries bilat,fundal placenta gr,marginal cord insertion,fhr 140 bpm,cx 2.8 cm,svp of fluid 4.6 cm,efw 226 g 53%,anatomy complete

## 2019-05-09 NOTE — Patient Instructions (Addendum)
Sheila Griffith, I greatly value your feedback.  If you receive a survey following your visit with [place] today, we appreciate you taking the time to fill it out.  Thanks, Knute Neu, CNM, [facility]  [place]!!! It is now [place] at [facility] ([place], [place] [address]) Entrance located off of [address] parking   Go to [email or web address] to register for FREE online childbirth classes  [place] Pediatricians/Family Doctors:  [facility] [phone]                 [place] [phone] (usually not accepting new patients unless you have family there already, you are always welcome to call and ask)       [facility] Department [phone]       [facility] Pediatricians/Family Doctors:   [facility]: [phone]  [facility]: [phone]  [facility]: [place] Doctors:   [facility]: [place]: [place]:  [place]: [phone]   For your lower back pain you may:  Purchase a pregnancy belt from [employer], [place], [place], etc and wear it while you are up and about  Take warm baths  Use a heating pad to your lower back for no longer than 20 minutes at a time, and do not place near abdomen  Take tylenol as needed. Please follow directions on the bottle  Kinesiology tape (can get from sporting goods store), google how to tape belly for pregnancy     Home Blood Pressure Monitoring for Patients   Your provider has recommended that you check your blood pressure (BP) at least once a week at home. If you do not have a blood pressure cuff at home, one will be provided for you. Contact your provider if you have not received your monitor within 1  week.   Helpful Tips for Accurate Home Blood Pressure Checks   Don't smoke, exercise, or drink caffeine 30 minutes before checking your BP  Use the restroom before checking your BP (a full bladder can raise your pressure)  Relax in a comfortable upright chair  Feet on the ground  Left arm resting comfortably on a flat surface at the level of your heart  Legs uncrossed  Back supported  Sit quietly and don't talk  Place the cuff on your bare arm  Adjust snuggly, so that only two fingertips can fit between your skin and the top of the cuff  Check 2 readings separated by at least one minute  Keep a log of your BP readings  For a visual, please reference this diagram: http://ccnc.care/bpdiagram  Provider Name: [facility]     Phone: [phone]  Zone 1: ALL CLEAR  Continue to monitor your symptoms:   BP reading is less than 140 (top number) or less than 90 (bottom number)   No right upper stomach pain  No headaches or seeing spots  No feeling nauseated or throwing up  No swelling in face and hands  Zone 2: CAUTION Call your doctor's office for any of the following:   BP reading is greater than 140 (top number) or greater than 90 (bottom number)   Stomach pain under your ribs in the middle or right side  Headaches or seeing spots  Feeling nauseated or throwing  up  Swelling in face and hands  Zone 3: EMERGENCY  Seek immediate medical care if you have any of the following:   BP reading is greater than160 (top number) or greater than 110 (bottom number)  Severe headaches not improving with Tylenol  Serious difficulty catching your breath  Any worsening symptoms from Zone 2     Second Trimester of Pregnancy The second trimester is from week 14 through week 27 (months 4 through 6). The second trimester is often a time when you feel your best. Your body has adjusted to being pregnant, and you begin to feel better physically. Usually, morning sickness  has lessened or quit completely, you may have more energy, and you may have an increase in appetite. The second trimester is also a time when the fetus is growing rapidly. At the end of the sixth month, the fetus is about 9 inches long and weighs about 1 pounds. You will likely begin to feel the baby move (quickening) between 16 and 20 weeks of pregnancy. Body changes during your second trimester Your body continues to go through many changes during your second trimester. The changes vary from woman to woman.  Your weight will continue to increase. You will notice your lower abdomen bulging out.  You may begin to get stretch marks on your hips, abdomen, and breasts.  You may develop headaches that can be relieved by medicines. The medicines should be approved by your health care provider.  You may urinate more often because the fetus is pressing on your bladder.  You may develop or continue to have heartburn as a result of your pregnancy.  You may develop constipation because certain hormones are causing the muscles that push waste through your intestines to slow down.  You may develop hemorrhoids or swollen, bulging veins (varicose veins).  You may have back pain. This is caused by: ? Weight gain. ? Pregnancy hormones that are relaxing the joints in your pelvis. ? A shift in weight and the muscles that support your balance.  Your breasts will continue to grow and they will continue to become tender.  Your gums may bleed and may be sensitive to brushing and flossing.  Dark spots or blotches (chloasma, mask of pregnancy) may develop on your face. This will likely fade after the baby is born.  A dark line from your belly button to the pubic area (linea nigra) may appear. This will likely fade after the baby is born.  You may have changes in your hair. These can include thickening of your hair, rapid growth, and changes in texture. Some women also have hair loss during or after  pregnancy, or hair that feels dry or thin. Your hair will most likely return to normal after your baby is born.  What to expect at prenatal visits During a routine prenatal visit:  You will be weighed to make sure you and the fetus are growing normally.  Your blood pressure will be taken.  Your abdomen will be measured to track your baby's growth.  The fetal heartbeat will be listened to.  Any test results from the previous visit will be discussed.  Your health care provider may ask you:  How you are feeling.  If you are feeling the baby move.  If you have had any abnormal symptoms, such as leaking fluid, bleeding, severe headaches, or abdominal cramping.  If you are using any tobacco products, including cigarettes, chewing tobacco, and electronic cigarettes.  If you have any questions.  Other tests that may be performed during your second trimester include:  Blood tests that check for: ? Low iron levels (anemia). ? High blood sugar that affects pregnant women (gestational diabetes) between 90 and 28 weeks. ? Rh antibodies. This is to check for a protein on red blood cells (Rh factor).  Urine tests to check for infections, diabetes, or protein in the urine.  An ultrasound to confirm the proper growth and development of the baby.  An amniocentesis to check for possible genetic problems.  Fetal screens for spina bifida and Down syndrome.  HIV (human immunodeficiency virus) testing. Routine prenatal testing includes screening for HIV, unless you choose not to have this test.  Follow these instructions at home: Medicines  Follow your health care provider's instructions regarding medicine use. Specific medicines may be either safe or unsafe to take during pregnancy.  Take a prenatal vitamin that contains at least 600 micrograms (mcg) of folic acid.  If you develop constipation, try taking a stool softener if your health care provider approves. Eating and drinking  Eat  a balanced diet that includes fresh fruits and vegetables, whole grains, good sources of protein such as meat, eggs, or tofu, and low-fat dairy. Your health care provider will help you determine the amount of weight gain that is right for you.  Avoid raw meat and uncooked cheese. These carry germs that can cause birth defects in the baby.  If you have low calcium intake from food, talk to your health care provider about whether you should take a daily calcium supplement.  Limit foods that are high in fat and processed sugars, such as fried and sweet foods.  To prevent constipation: ? Drink enough fluid to keep your urine clear or pale yellow. ? Eat foods that are high in fiber, such as fresh fruits and vegetables, whole grains, and beans. Activity  Exercise only as directed by your health care provider. Most women can continue their usual exercise routine during pregnancy. Try to exercise for 30 minutes at least 5 days a week. Stop exercising if you experience uterine contractions.  Avoid heavy lifting, wear low heel shoes, and practice good posture.  A sexual relationship may be continued unless your health care provider directs you otherwise. Relieving pain and discomfort  Wear a good support bra to prevent discomfort from breast tenderness.  Take warm sitz baths to soothe any pain or discomfort caused by hemorrhoids. Use hemorrhoid cream if your health care provider approves.  Rest with your legs elevated if you have leg cramps or low back pain.  If you develop varicose veins, wear support hose. Elevate your feet for 15 minutes, 3-4 times a day. Limit salt in your diet. Prenatal Care  Write down your questions. Take them to your prenatal visits.  Keep all your prenatal visits as told by your health care provider. This is important. Safety  Wear your seat belt at all times when driving.  Make a list of emergency phone numbers, including numbers for family, friends, the hospital,  and police and fire departments. General instructions  Ask your health care provider for a referral to a local prenatal education class. Begin classes no later than the beginning of month 6 of your pregnancy.  Ask for help if you have counseling or nutritional needs during pregnancy. Your health care provider can offer advice or refer you to specialists for help with various needs.  Do not use hot tubs, steam rooms, or saunas.  Do not douche or  use tampons or scented sanitary pads.  Do not cross your legs for long periods of time.  Avoid cat litter boxes and soil used by cats. These carry germs that can cause birth defects in the baby and possibly loss of the fetus by miscarriage or stillbirth.  Avoid all smoking, herbs, alcohol, and unprescribed drugs. Chemicals in these products can affect the formation and growth of the baby.  Do not use any products that contain nicotine or tobacco, such as cigarettes and e-cigarettes. If you need help quitting, ask your health care provider.  Visit your dentist if you have not gone yet during your pregnancy. Use a soft toothbrush to brush your teeth and be gentle when you floss. Contact a health care provider if:  You have dizziness.  You have mild pelvic cramps, pelvic pressure, or nagging pain in the abdominal area.  You have persistent nausea, vomiting, or diarrhea.  You have a bad smelling vaginal discharge.  You have pain when you urinate. Get help right away if:  You have a fever.  You are leaking fluid from your vagina.  You have spotting or bleeding from your vagina.  You have severe abdominal cramping or pain.  You have rapid weight gain or weight loss.  You have shortness of breath with chest pain.  You notice sudden or extreme swelling of your face, hands, ankles, feet, or legs.  You have not felt your baby move in over an hour.  You have severe headaches that do not go away when you take medicine.  You have vision  changes. Summary  The second trimester is from week 14 through week 27 (months 4 through 6). It is also a time when the fetus is growing rapidly.  Your body goes through many changes during pregnancy. The changes vary from woman to woman.  Avoid all smoking, herbs, alcohol, and unprescribed drugs. These chemicals affect the formation and growth your baby.  Do not use any tobacco products, such as cigarettes, chewing tobacco, and e-cigarettes. If you need help quitting, ask your health care provider.  Contact your health care provider if you have any questions. Keep all prenatal visits as told by your health care provider. This is important. This information is not intended to replace advice given to you by your health care provider. Make sure you discuss any questions you have with your health care provider. Document Released: 06/20/2001 Document Revised: 12/02/2015 Document Reviewed: 08/27/2012 Elsevier Interactive Patient Education  2017 [employer] Inc.   PROTECT YOURSELF & YOUR BABY FROM THE FLU! Because you are pregnant, we at [facility], along with the Centers for Disease Control (CDC), recommend that you receive the flu vaccine to protect yourself and your baby from the flu. The flu is more likely to cause severe illness in pregnant women than in women of reproductive age who are not pregnant. Changes in the immune system, heart, and lungs during pregnancy make pregnant women (and women up to two weeks postpartum) more prone to severe illness from flu, including illness resulting in hospitalization. Flu also may be harmful for a pregnant womans developing baby. A common flu symptom is fever, which may be associated with neural tube defects and other adverse outcomes for a developing baby. Getting vaccinated can also help protect a baby after birth from flu. (Mom passes antibodies onto the developing baby during her pregnancy.)  A Flu Vaccine is the Best Protection Against Flu Getting a flu  vaccine is the first and most important step  in protecting against flu. Pregnant women should get a flu shot and not the live attenuated influenza vaccine (LAIV), also known as nasal spray flu vaccine. Flu vaccines given during pregnancy help protect both the mother and her baby from flu. Vaccination has been shown to reduce the risk of flu-associated acute respiratory infection in pregnant women by up to one-half. A 2018 study showed that getting a flu shot reduced a pregnant womans risk of being hospitalized with flu by an average of 40 percent. Pregnant women who get a flu vaccine are also helping to protect their babies from flu illness for the first several months after their birth, when they are too young to get vaccinated.   A Long Record of Safety for Flu Shots in Pregnant Women Flu shots have been given to millions of pregnant women over many years with a good safety record. There is a lot of evidence that flu vaccines can be given safely during pregnancy; though these data are limited for the first trimester. The CDC recommends that pregnant women get vaccinated during any trimester of their pregnancy. It is very important for pregnant women to get the flu shot.   Other Preventive Actions In addition to getting a flu shot, pregnant women should take the same everyday preventive actions the CDC recommends of everyone, including covering coughs, washing hands often, and avoiding people who are sick.  Symptoms and Treatment If you get sick with flu symptoms call your doctor right away. There are antiviral drugs that can treat flu illness and prevent serious flu complications. The CDC recommends prompt treatment for people who have influenza infection or suspected influenza infection and who are at high risk of serious flu complications, such as people with asthma, diabetes (including gestational diabetes), or heart disease. Early treatment of influenza in hospitalized pregnant women has been shown  to reduce the length of the hospital stay.  Symptoms Flu symptoms include fever, cough, sore throat, runny or stuffy nose, body aches, headache, chills and fatigue. Some people may also have vomiting and diarrhea. People may be infected with the flu and have respiratory symptoms without a fever.  Early Treatment is Important for Pregnant Women Treatment should begin as soon as possible because antiviral drugs work best when started early (within 48 hours after symptoms start). Antiviral drugs can make your flu illness milder and make you feel better faster. They may also prevent serious health problems that can result from flu illness. Oral oseltamivir (Tamiflu) is the preferred treatment for pregnant women because it has the most studies available to suggest that it is safe and beneficial. Antiviral drugs require a prescription from your provider. Having a fever caused by flu infection or other infections early in pregnancy may be linked to birth defects in a baby. In addition to taking antiviral drugs, pregnant women who get a fever should treat their fever with Tylenol (acetaminophen) and contact their provider immediately.  When to Seek Emergency Medical Care If you are pregnant and have any of these signs, seek care immediately:  Difficulty breathing or shortness of breath  Pain or pressure in the chest or abdomen  Sudden dizziness  Confusion  Severe or persistent vomiting  High fever that is not responding to Tylenol (or store brand equivalent)  Decreased or no movement of your baby  [email or web address]

## 2019-05-09 NOTE — Progress Notes (Signed)
LOW-RISK PREGNANCY VISIT Patient name: Sheila Griffith MRN [id number]  Date of birth: October 12, 1995 Chief Complaint:   Routine Prenatal Visit (anatomy scan)  History of Present Illness:   Sheila Griffith is a 23 y.o. G26P0020 female at [redacted]w[redacted]d with an Estimated Date of Delivery: 10/10/19 being seen today for ongoing management of a low-risk pregnancy.  Today she reports low back pain. Thinks last pap was 2-66yrs ago, was normal, doesn't want to do until postpartum.  Contractions: Not present. Vag. Bleeding: None.  Movement: Absent. denies leaking of fluid. Review of Systems:   Pertinent items are noted in HPI Denies abnormal vaginal discharge w/ itching/odor/irritation, headaches, visual changes, shortness of breath, chest pain, abdominal pain, severe nausea/vomiting, or problems with urination or bowel movements unless otherwise stated above. Pertinent History Reviewed:  Reviewed past medical,surgical, social, obstetrical and family history.  Reviewed problem list, medications and allergies. Physical Assessment:   Vitals:   05/09/19 0918  BP: 106/68  Pulse: 83  Weight: 128 lb (58.1 kg)  Body mass index is 20.05 kg/m.        Physical Examination:   General appearance: Well appearing, and in no distress  Mental status: Alert, oriented to person, place, and time  Skin: Warm & dry  Cardiovascular: Normal heart rate noted  Respiratory: Normal respiratory effort, no distress  Abdomen: Soft, gravid, nontender  Pelvic: Cervical exam deferred         Extremities: Edema: None  Fetal Status: Fetal Heart Rate (bpm): 140 u/s   Movement: Absent    [place] 18 wks,transverse head right,normal ovaries bilat,fundal placenta gr,marginal cord insertion,fhr 140 bpm,cx 2.8 cm,svp of fluid 4.6 cm,efw 226 g 53%,anatomy complete  Chaperone: n/a    Results for orders placed or performed in visit on 05/09/19 (from the past 24 hour(s))  POC Urinalysis Dipstick OB   Collection Time: 05/09/19  9:23 AM  Result Value  Ref Range   Color, UA     Clarity, UA     Glucose, UA Negative Negative   Bilirubin, UA     Ketones, UA neg    Spec Grav, UA     Blood, UA neg    pH, UA     POC,PROTEIN,UA Negative Negative, Trace, Small (1+), Moderate (2+), Large (3+), 4+   Urobilinogen, UA     Nitrite, UA neg    Leukocytes, UA Negative Negative   Appearance     Odor      Assessment & Plan:  1) Low-risk pregnancy G3P0020 at [redacted]w[redacted]d with an Estimated Date of Delivery: 10/10/19   2) Marginal cord insertion, discussed  3) Low back pain> gave printed prevention/relief measures    Meds: No orders of the defined types were placed in this encounter.  Labs/procedures today: anatomy u/s, can't do AFP today, will come one day next week. Declined flu shot. She was advised that the flu shot is recommended during pregnancy to help protect her and her baby. We discussed that it is an inactivated vaccine-so side effects are minimal, it is considered safe to receive during any trimester, and pregnant women are at a higher risk of developing potential complications from the flu, including death. She was given printed information from the CDC regarding the flu shot and the flu.    Plan:  Continue routine obstetrical care  Next visit: prefers online    Reviewed: Preterm labor symptoms and general obstetric precautions including but not limited to vaginal bleeding, contractions, leaking of fluid and fetal movement were reviewed in detail  with the patient.  All questions were answered. Has home bp cuff. Check bp weekly, let us know if >140/90.   Follow-up: Return in about 4 weeks (around 06/06/2019) for [place], [place], CNM.  Orders Placed This Encounter  Procedures  . AFP TETRA  . POC Urinalysis Dipstick OB   Roma Schanz CNM, WHNP-BC 05/09/2019 10:14 AM

## 2019-06-09 ENCOUNTER — Other Ambulatory Visit: Payer: Self-pay

## 2019-06-09 ENCOUNTER — Telehealth (INDEPENDENT_AMBULATORY_CARE_PROVIDER_SITE_OTHER): Payer: Medicaid Other | Admitting: Advanced Practice Midwife

## 2019-06-09 ENCOUNTER — Encounter: Payer: Self-pay | Admitting: Advanced Practice Midwife

## 2019-06-09 VITALS — BP 115/72 | HR 86 | Ht 67.0 in

## 2019-06-09 DIAGNOSIS — Z3A22 22 weeks gestation of pregnancy: Secondary | ICD-10-CM | POA: Diagnosis not present

## 2019-06-09 DIAGNOSIS — Z3482 Encounter for supervision of other normal pregnancy, second trimester: Secondary | ICD-10-CM

## 2019-06-09 NOTE — Patient Instructions (Addendum)
1. Before your test, do not eat or drink anything for 8-10 hours prior to your  appointment (a small amount of water is allowed and you may take any medicines you normally take). Be sure to drink lots of water the day before. 2. When you arrive, your blood will be drawn for a 'fasting' blood sugar level.  Then you will be given a sweetened carbonated beverage to drink. You should  complete drinking this beverage within five minutes. After finishing the  beverage, you will have your blood drawn exactly 1 and 2 hours later. Having  your blood drawn on time is an important part of this test. A total of three blood  samples will be done. 3. The test takes approximately 2  hours. During the test, do not have anything to  eat or drink. Do not smoke, chew gum (not even sugarless gum) or use breath mints.  4. During the test you should remain close by and seated as much as possible and  avoid walking around. You may want to bring a book or something else to  occupy your time.  5. After your test, you may eat and drink as normal. You may want to bring a snack  to eat after the test is finished. Your provider will advise you as to the results of  this test and any follow-up if necessary  If your sugar test is positive for gestational diabetes, you will be given an phone call and further instructions discussed. If you wish to know all of your test results before your next appointment, feel free to call the office, or look up your test results on Mychart.  (The range that the lab uses for normal values of the sugar test are not necessarily the range that is used for pregnant women; if your results are within the normal range, they are definitely normal.  However, if a value is deemed "high" by the lab, it may not be too high for a pregnant woman.  We will need to discuss the results if your value(s) fall in the "high" category).     Tdap Vaccine  It is recommended that you get the Tdap vaccine during the  third trimester of EACH pregnancy to help protect your baby from getting pertussis (whooping cough)  27-36 weeks is the BEST time to do this so that you can pass the protection on to your baby. During pregnancy is better than after pregnancy, but if you are unable to get it during pregnancy it will be offered at the hospital.  You will be offered this vaccine in the office after 27 weeks.  If you do not have health insurance, you can get the vaccine from the [facility] (no appointment needed).   Everyone who will be around your baby should also be up-to-date on their vaccines. Adults (who are not pregnant) only need 1 dose of Tdap during adulthood.  Safe Medications in Pregnancy   Acne: Benzoyl Peroxide Salicylic Acid  Backache/Headache: Tylenol: 2 regular strength every 4 hours OR              2 Extra strength every 6 hours  Colds/Coughs/Allergies: Benadryl (alcohol free) 25 mg every 6 hours as needed Breath right strips Claritin Cepacol throat lozenges Chloraseptic throat spray Cold-Eeze- up to three times per day Cough drops, alcohol free Flonase (by prescription only) Guaifenesin Mucinex Robitussin DM (plain only, alcohol free) Saline nasal spray/drops Sudafed (pseudoephedrine) & Actifed ** use only after [redacted]   weeks gestation and if you do not have high blood pressure Tylenol Vicks Vaporub Zinc lozenges Zyrtec   Constipation: Colace Ducolax suppositories Fleet enema Glycerin suppositories Metamucil Milk of magnesia Miralax Senokot Smooth move tea  Diarrhea: Kaopectate Imodium A-D  *NO pepto Bismol  Hemorrhoids: Anusol Anusol HC Preparation H Tucks  Indigestion: Tums Maalox Mylanta Zantac  Pepcid  Insomnia: Benadryl (alcohol free) 25mg every 6 hours as needed Tylenol PM Unisom, no Gelcaps  Leg Cramps: Tums MagGel  Nausea/Vomiting:  Bonine Dramamine Emetrol Ginger extract Sea bands Meclizine  Nausea medication  to take during pregnancy:  Unisom (doxylamine succinate 25 mg tablets) Take one tablet daily at bedtime. If symptoms are not adequately controlled, the dose can be increased to a maximum recommended dose of two tablets daily (1/2 tablet in the morning, 1/2 tablet mid-afternoon and one at bedtime). Vitamin B6 100mg tablets. Take one tablet twice a day (up to 200 mg per day).  Skin Rashes: Aveeno products Benadryl cream or 25mg every 6 hours as needed Calamine Lotion 1% cortisone cream  Yeast infection: Gyne-lotrimin 7 Monistat 7   **If taking multiple medications, please check labels to avoid duplicating the same active ingredients **take medication as directed on the label ** Do not exceed 4000 mg of tylenol in 24 hours **Do not take medications that contain aspirin or ibuprofen       

## 2019-06-09 NOTE — Progress Notes (Addendum)
   TELEHEALTH VIRTUAL OBSTETRICS VISIT ENCOUNTER NOTE  I connected with Sheila Griffith on 06/09/19 at  3:30 PM EST by telephone at home and verified that I am speaking with the correct person using two identifiers.   I discussed the limitations, risks, security and privacy concerns of performing an evaluation and management service by telephone and the availability of in person appointments. I also discussed with the patient that there may be a patient responsible charge related to this service. The patient expressed understanding and agreed to proceed.  Subjective:  Sheila Griffith is a 23 y.o. G3P0020 at [redacted]w[redacted]d being followed for ongoing prenatal care.  She is currently monitored for the following issues for this low-risk pregnancy and has Supervision of normal pregnancy on their problem list.  Patient reports nasal congestion and vaginal discharge. Has had congestion for 3 days. Denies fever or recent sick contacts. Has taken "half dose" of Sudafed because she was unsure if she could take during pregnancy. Reports symptoms are improving. Has a white discharge with possible odor. Thinks she has odor but cannot tell if there is one due to her nasal congestions. Reports fiance denies odor. She also has concerns about constipation. Feels like she has to strain at times. Unsure what is safe to take.  Reports fetal movement. Denies any contractions, bleeding or leaking of fluid.   The following portions of the patient's history were reviewed and updated as appropriate: allergies, current medications, past family history, past medical history, past social history, past surgical history and problem list.   Objective:   General:  Alert, oriented and cooperative.   Mental Status: Normal mood and affect perceived. Normal judgment and thought content.  Rest of physical exam deferred due to type of encounter  Assessment and Plan:  Pregnancy: G3P0020 at [redacted]w[redacted]d 1. Encounter for supervision of other normal  pregnancy in second trimester - Continue routine OB care  - GTT and labs at next visit  2. Vaginal discharge - White discharge with possible odor - Pt to come in tomorrow for nurse visit and vaginal swab  3. Nasal congestion - Continue taking Sudafed. Can take full dose - Given information on congestion and safe medications in pregnancy  4. Constipation - Encouraged the use of stool softeners, increase water and fiber intake - Given information on constipation in pregnancy and things to try at home  Preterm labor symptoms and general obstetric precautions including but not limited to vaginal bleeding, contractions, leaking of fluid and fetal movement were reviewed in detail with the patient.  I discussed the assessment and treatment plan with the patient. The patient was provided an opportunity to ask questions and all were answered. The patient agreed with the plan and demonstrated an understanding of the instructions. The patient was advised to call back or seek an in-person office evaluation/go to [facility] at [facility] for any urgent or concerning symptoms. Please refer to After Visit Summary for other counseling recommendations.   I provided 15 minutes of non-face-to-face time during this encounter.  Return in about 4 weeks (around 07/07/2019) for PN2/LROB.    Maryagnes Amos, [place] for [facility] [employer]  I personally saw and evaluated the patient, performing the key elements of the service. I developed and verified the management plan that is described in the resident's/student's note, and I agree with the content with my edits above. Nigel Berthold, CNM 06/09/2019 9:50 PM

## 2019-06-09 NOTE — Progress Notes (Signed)
Pt states her vaginal discharge has an odor.

## 2019-06-11 ENCOUNTER — Other Ambulatory Visit: Payer: Self-pay

## 2019-06-11 ENCOUNTER — Encounter (HOSPITAL_COMMUNITY): Payer: Self-pay | Admitting: *Deleted

## 2019-06-11 ENCOUNTER — Emergency Department (HOSPITAL_COMMUNITY)
Admission: EM | Admit: 2019-06-11 | Discharge: 2019-06-11 | Disposition: A | Discharge status: Home / Self Care | Payer: Medicaid Other | Attending: Emergency Medicine | Admitting: Emergency Medicine

## 2019-06-11 DIAGNOSIS — Z79899 Other long term (current) drug therapy: Secondary | ICD-10-CM | POA: Insufficient documentation

## 2019-06-11 DIAGNOSIS — Z20828 Contact with and (suspected) exposure to other viral communicable diseases: Secondary | ICD-10-CM

## 2019-06-11 DIAGNOSIS — Z87891 Personal history of nicotine dependence: Secondary | ICD-10-CM | POA: Insufficient documentation

## 2019-06-11 DIAGNOSIS — J4521 Mild intermittent asthma with (acute) exacerbation: Secondary | ICD-10-CM | POA: Diagnosis not present

## 2019-06-11 DIAGNOSIS — Z3A00 Weeks of gestation of pregnancy not specified: Secondary | ICD-10-CM | POA: Diagnosis not present

## 2019-06-11 DIAGNOSIS — Z20822 Contact with and (suspected) exposure to covid-19: Secondary | ICD-10-CM

## 2019-06-11 DIAGNOSIS — O99512 Diseases of the respiratory system complicating pregnancy, second trimester: Secondary | ICD-10-CM | POA: Diagnosis not present

## 2019-06-11 DIAGNOSIS — R0602 Shortness of breath: Secondary | ICD-10-CM | POA: Diagnosis present

## 2019-06-11 MED ORDER — ALBUTEROL SULFATE HFA 108 (90 BASE) MCG/ACT IN AERS
4.0000 | INHALATION_SPRAY | Freq: Once | RESPIRATORY_TRACT | Status: AC
  Administered 2019-06-11: 4 via RESPIRATORY_TRACT
  Filled 2019-06-11: qty 6.7

## 2019-06-11 MED ORDER — PREDNISONE 10 MG PO TABS
40.0000 mg | ORAL_TABLET | Freq: Every day | ORAL | 0 refills | Status: DC
Start: 2019-06-11 — End: 2019-10-02

## 2019-06-11 MED ORDER — PREDNISONE 50 MG PO TABS
50.0000 mg | ORAL_TABLET | Freq: Every day | ORAL | Status: DC
  Administered 2019-06-11: 50 mg via ORAL
  Filled 2019-06-11: qty 1

## 2019-06-11 NOTE — Discharge Instructions (Signed)
Start prednisone tomorrow and take as prescribed.  Take your albuterol inhaler every 4-6 hours as needed for wheezing.  Please follow-up with your OB/GYN within a week or with the OB/GYN included on your discharge packet.  Return to the ED immediately for new or worsening symptoms or concerns, such as new or worsening shortness of breath, fevers, vomiting, chest pain or any concerns at all.

## 2019-06-11 NOTE — ED Triage Notes (Signed)
Pt c/o sob, nasal congestion, states that she was seen a month ago for same thing, pt states that symptoms started 2 days ago,

## 2019-06-11 NOTE — ED Provider Notes (Signed)
[facility] EMERGENCY DEPARTMENT Provider Note   CSN: [id number] Arrival date & time: 06/11/19  1413     History   Chief Complaint Chief Complaint  Patient presents with  . Shortness of Breath    HPI Sheila Griffith is a 23 y.o. female.     HPI    23 year old female, with a PMH of asthma, 5 months pregnant, presents with wheezing.  Patient states that she has had an increase in her asthma over the last several months since being pregnant.  She notes that she has been wheezing for the last 2 days and feeling short of breath.  She took 2 puffs of her inhaler this morning with no improvement in her symptoms.  She notes associated nasal congestion and a mild sore throat.  She denies any fevers, chills.  She states a dry cough.  Denies any chest pain, nausea, vomiting, abdominal pain.  Past Medical History:  Diagnosis Date  . Anxiety   . Asthma    childhood    Patient Active Problem List   Diagnosis Date Noted  . Supervision of normal pregnancy 03/26/2019    Past Surgical History:  Procedure Laterality Date  . DENTAL SURGERY       OB History    Gravida  3   Para      Term      Preterm      AB  2   Living        SAB  2   TAB      Ectopic      Multiple      Live Births               Home Medications    Prior to Admission medications   Medication Sig Start Date End Date Taking? Authorizing Provider  albuterol (VENTOLIN HFA) 108 (90 Base) MCG/ACT inhaler Inhale 1-2 puffs into the lungs every 6 (six) hours as needed for wheezing or shortness of breath. 04/24/19   Zadie Rhine, MD  Blood Pressure Monitor MISC For regular home bp monitoring during pregnancy 03/26/19   Cheral Marker, CNM  HYDROCORTISONE, TOPICAL, 2 % LOTN Apply small amt around eyes BID prn 03/26/19   Cheral Marker, CNM  Prenatal Vit-Fe Fumarate-FA (PRENATAL VITAMIN PO) Take by mouth.    [provider]  Pseudoephedrine HCl (SUDAFED PO) Take by mouth as needed.     [provider]    Family History Family History  Problem Relation Age of Onset  . Depression Mother   . Anxiety disorder Mother   . Arthritis Mother   . Diabetes Father   . Stroke Father   . Heart disease Father   . Depression Father   . Anxiety disorder Father     Social History Social History   Tobacco Use  . Smoking status: Former Smoker    Types: Cigarettes    Quit date: 07/10/2014    Years since quitting: 4.9  . Smokeless tobacco: Never Used  . Tobacco comment: previously social smoker  Substance Use Topics  . Alcohol use: Never    Frequency: Never  . Drug use: Not Currently    Types: Marijuana    Comment: none since 2017     Allergies   Peanut-containing drug products   Review of Systems Review of Systems  Constitutional: Negative for chills and fever.  HENT: Positive for rhinorrhea and sore throat.   Respiratory: Positive for cough, shortness of breath and wheezing.   Cardiovascular:  Negative for chest pain.  Gastrointestinal: Negative for abdominal pain, nausea and vomiting.     Physical Exam Updated Vital Signs BP 118/65 (BP Location: Right Arm)   Pulse 84   Temp 98.2 F (36.8 C) (Oral)   Resp 16   Ht  (1.702 m)   Wt 59 kg   LMP  (LMP Unknown)   SpO2 98%   BMI 20.36 kg/m   Physical Exam Vitals signs and nursing note reviewed.  Constitutional:      Appearance: She is well-developed.  HENT:     Head: Normocephalic and atraumatic.     Nose: Congestion and rhinorrhea present. Rhinorrhea is clear.     Mouth/Throat:     Mouth: Mucous membranes are moist. No oral lesions.     Palate: No mass.     Pharynx: Oropharynx is clear. Uvula midline. No pharyngeal swelling.  Eyes:     Conjunctiva/sclera: Conjunctivae normal.  Neck:     Musculoskeletal: Neck supple.  Cardiovascular:     Rate and Rhythm: Normal rate and regular rhythm.     Heart sounds: Normal heart sounds. No murmur.  Pulmonary:     Effort: Pulmonary effort is  normal. No tachypnea or respiratory distress.     Breath sounds: Examination of the right-upper field reveals wheezing. Examination of the left-upper field reveals wheezing. Examination of the right-middle field reveals wheezing. Examination of the left-middle field reveals wheezing. Examination of the right-lower field reveals wheezing. Examination of the left-lower field reveals wheezing. Wheezing present. No rales.  Abdominal:     General: Bowel sounds are normal. There is no distension.     Palpations: Abdomen is soft.     Tenderness: There is no abdominal tenderness.  Musculoskeletal: Normal range of motion.        General: No tenderness or deformity.  Skin:    General: Skin is warm and dry.     Findings: No erythema or rash.  Neurological:     Mental Status: She is alert and oriented to person, place, and time.  Psychiatric:        Behavior: Behavior normal.      ED Treatments / Results  Labs (all labs ordered are listed, but only abnormal results are displayed) Labs Reviewed  NOVEL CORONAVIRUS, NAA (HOSP ORDER, SEND-OUT TO REF LAB; TAT 18-24 HRS)    EKG None  Radiology No results found.  Procedures Procedures (including critical care time)  Medications Ordered in ED Medications  albuterol (VENTOLIN HFA) 108 (90 Base) MCG/ACT inhaler 4 puff (has no administration in time range)  predniSONE (DELTASONE) tablet 50 mg (has no administration in time range)     Initial Impression / Assessment and Plan / ED Course  I have reviewed the triage vital signs and the nursing notes.  Pertinent labs & imaging results that were available during my care of the patient were reviewed by me and considered in my medical decision making (see chart for details).        Patient presented with wheezing.  She has a known history of asthma.  Patient currently pregnant.  On initial exam she has wheezing and stridor Throughout.  She was given inhalers.  Discussed risk and benefits and on  her natives to prednisone during pregnancy.  Patient was agreeable with steroids.  On reevaluation patient is significantly improved.  She states she is feeling much better.  Lungs with only faint expiratory wheezing heard throughout.  Patient was tested for coronavirus.  Encouraged self quarantine  until results are back.  Given return precautions.  Patient ready and stable for discharge.  Sheila Griffith was evaluated in Emergency Department on 06/11/2019 for the symptoms described in the history of present illness. She was evaluated in the context of the global COVID-19 pandemic, which necessitated consideration that the patient might be at risk for infection with the SARS-CoV-2 virus that causes COVID-19. Institutional protocols and algorithms that pertain to the evaluation of patients at risk for COVID-19 are in a state of rapid change based on information released by regulatory bodies including the CDC and federal and state organizations. These policies and algorithms were followed during the patient's care in the ED.   At this time there does not appear to be any evidence of an acute emergency medical condition and the patient appears stable for discharge with appropriate outpatient follow up.Diagnosis was discussed with patient who verbalizes understanding and is agreeable to discharge.   Final Clinical Impressions(s) / ED Diagnoses   Final diagnoses:  None    ED Discharge Orders    None       Rachel Moulds 06/12/19 0109    Ezequiel Essex, MD 06/12/19 [phone]

## 2019-06-12 LAB — NOVEL CORONAVIRUS, NAA (HOSP ORDER, SEND-OUT TO REF LAB; TAT 18-24 HRS): SARS-CoV-2, NAA: NOT DETECTED

## 2019-07-07 ENCOUNTER — Other Ambulatory Visit: Payer: Self-pay | Admitting: Women's Health

## 2019-07-07 MED ORDER — HYDROCORTISONE 2 % EX LOTN: TOPICAL_LOTION | CUTANEOUS | 0 refills | Status: DC

## 2019-07-11 NOTE — L&D Delivery Note (Signed)
OB/GYN Faculty Practice Delivery Note  Sheila Griffith is a 24 y.o. G3P0020 s/p VD at [redacted]w[redacted]d. She was admitted after having LOF and intermittent contractions at home.   ROM: 20h 27m with clear fluid GBS Status:  Negative/-- (03/19 1430) Maximum Maternal Temperature: 98.9 F   Labor Progress: . Initial SVE: 5/100/0. Received Cytotec x2 and pitocin. She then progressed to complete.   Delivery Date/Time: 10/11/19  1053 Delivery: Called to room and patient was complete and pushing. Head delivered LOA. No nuchal cord present. Shoulder and body delivered in usual fashion. Infant with spontaneous cry, placed on mother's abdomen, dried and stimulated. Cord clamped x 2 after 1-minute delay, and cut by FOB. Cord blood drawn. Placenta delivered spontaneously with gentle cord traction. Fundus firm with massage and Pitocin. Labia, perineum, vagina, and cervix inspected inspected with bilateral periurethral tears.  The right periurethral tear was repaired with 4-0 Vicryl.   Baby Weight: pending Placenta: Sent to L&D Complications: None Lacerations: bilateral periurethral tears, right repaired  EBL: 75 mL Analgesia: Epidural  Infant: APGAR (1 MIN): 9   APGAR (5 MINS): 9   APGAR (10 MINS):     Katha Cabal, DO PGY-1, [facility] 10/11/2019 11:12 AM

## 2019-08-13 ENCOUNTER — Other Ambulatory Visit: Payer: Medicaid Other

## 2019-08-13 ENCOUNTER — Other Ambulatory Visit: Payer: Self-pay

## 2019-08-13 ENCOUNTER — Ambulatory Visit (INDEPENDENT_AMBULATORY_CARE_PROVIDER_SITE_OTHER): Payer: Medicaid Other | Admitting: Advanced Practice Midwife

## 2019-08-13 VITALS — BP 117/72 | HR 83 | Wt 147.2 lb

## 2019-08-13 DIAGNOSIS — O26843 Uterine size-date discrepancy, third trimester: Secondary | ICD-10-CM

## 2019-08-13 DIAGNOSIS — Z3A31 31 weeks gestation of pregnancy: Secondary | ICD-10-CM

## 2019-08-13 DIAGNOSIS — J45909 Unspecified asthma, uncomplicated: Secondary | ICD-10-CM

## 2019-08-13 DIAGNOSIS — Z3403 Encounter for supervision of normal first pregnancy, third trimester: Secondary | ICD-10-CM

## 2019-08-13 DIAGNOSIS — O99513 Diseases of the respiratory system complicating pregnancy, third trimester: Secondary | ICD-10-CM

## 2019-08-13 DIAGNOSIS — Z3483 Encounter for supervision of other normal pregnancy, third trimester: Secondary | ICD-10-CM

## 2019-08-13 DIAGNOSIS — O43199 Other malformation of placenta, unspecified trimester: Secondary | ICD-10-CM | POA: Insufficient documentation

## 2019-08-13 DIAGNOSIS — O99512 Diseases of the respiratory system complicating pregnancy, second trimester: Secondary | ICD-10-CM | POA: Insufficient documentation

## 2019-08-13 DIAGNOSIS — O43193 Other malformation of placenta, third trimester: Secondary | ICD-10-CM

## 2019-08-13 DIAGNOSIS — O26849 Uterine size-date discrepancy, unspecified trimester: Secondary | ICD-10-CM | POA: Insufficient documentation

## 2019-08-13 NOTE — Patient Instructions (Signed)
Sheila Griffith, I greatly value your feedback.  If you receive a survey following your visit with us today, we appreciate you taking the time to fill it out.  Thanks, Kim Ramiro Pangilinan CNM  [facility]!!! It is now [facility] ([address] [place], [place] [address]) Entrance located off of [address] Free 24/7 valet parking    Go to [email or web address] to register for FREE online childbirth classes   Call the office ([phone]) or go to [facility] if:  You begin to have strong, frequent contractions  Your water breaks.  Sometimes it is a big gush of fluid, sometimes it is just a trickle that keeps getting your panties wet or running down your legs  You have vaginal bleeding.  It is normal to have a small amount of spotting if your cervix was checked.   You don't feel your baby moving like normal.  If you don't, get you something to eat and drink and lay down and focus on feeling your baby move.  You should feel at least 10 movements in 2 hours.  If you don't, you should call the office or go to [facility].    Tdap Vaccine  It is recommended that you get the Tdap vaccine during the third trimester of EACH pregnancy to help protect your baby from getting pertussis (whooping cough)  27-36 weeks is the BEST time to do this so that you can pass the protection on to your baby. During pregnancy is better than after pregnancy, but if you are unable to get it during pregnancy it will be offered at the hospital.   You can get this vaccine with us, at the health department, your family doctor, or some local pharmacies  Everyone who will be around your baby should also be up-to-date on their vaccines before the baby comes. Adults (who are not pregnant) only need 1 dose of Tdap during adulthood.   [place] [employer] Pediatricians/Family Doctors:  [facility] [phone]            [facility] [phone]                  [facility] [phone] (usually not accepting new patients unless you have family there already, you are always welcome to call and ask)       [facility] [phone]       [place] Pediatricians/Family Doctors:   [facility]: [phone]  [facility]: [phone]  [facility]: [phone]  [place] Family Doctors:   [facility]: [phone]   [facility]: [phone]  [place] Family Doctors:  [facility]: [phone]   Home Blood Pressure Monitoring for Patients   Your provider has recommended that you check your blood pressure (BP) at least once a week at home. If you do not have a blood pressure cuff at home, one will be provided for you. Contact your provider if you have not received your monitor within 1 week.   Helpful Tips for Accurate Home Blood Pressure Checks  . Don't smoke, exercise, or drink caffeine 30 minutes before checking your BP . Use the restroom before checking your BP (a full bladder can raise your pressure) . Relax in a comfortable upright chair . Feet on the ground . Left arm resting comfortably on a flat surface at the level of your heart . Legs uncrossed . Back supported . Sit quietly and don't talk .   Place the cuff on your bare arm . Adjust snuggly, so that only two fingertips can fit between your skin and the top of the cuff . Check 2 readings separated by at least one minute . Keep a log of your BP readings . For a visual, please reference this diagram: http://ccnc.care/bpdiagram  Provider Name: [facility]     Phone: [phone]  Zone 1: ALL CLEAR  Continue to monitor your symptoms:  . BP reading is less than 140 (top number) or less than 90 (bottom number)  . No right upper stomach pain . No headaches or seeing spots . No feeling nauseated or throwing up . No swelling in face and  hands  Zone 2: CAUTION Call your doctor's office for any of the following:  . BP reading is greater than 140 (top number) or greater than 90 (bottom number)  . Stomach pain under your ribs in the middle or right side . Headaches or seeing spots . Feeling nauseated or throwing up . Swelling in face and hands  Zone 3: EMERGENCY  Seek immediate medical care if you have any of the following:  . BP reading is greater than160 (top number) or greater than 110 (bottom number) . Severe headaches not improving with Tylenol . Serious difficulty catching your breath . Any worsening symptoms from Zone 2   Third Trimester of Pregnancy The third trimester is from week 29 through week 42, months 7 through 9. The third trimester is a time when the fetus is growing rapidly. At the end of the ninth month, the fetus is about 20 inches in length and weighs 6-10 pounds.  BODY CHANGES Your body goes through many changes during pregnancy. The changes vary from woman to woman.   Your weight will continue to increase. You can expect to gain 25-35 pounds (11-16 kg) by the end of the pregnancy.  You may begin to get stretch marks on your hips, abdomen, and breasts.  You may urinate more often because the fetus is moving lower into your pelvis and pressing on your bladder.  You may develop or continue to have heartburn as a result of your pregnancy.  You may develop constipation because certain hormones are causing the muscles that push waste through your intestines to slow down.  You may develop hemorrhoids or swollen, bulging veins (varicose veins).  You may have pelvic pain because of the weight gain and pregnancy hormones relaxing your joints between the bones in your pelvis. Backaches may result from overexertion of the muscles supporting your posture.  You may have changes in your hair. These can include thickening of your hair, rapid growth, and changes in texture. Some women also have hair loss during  or after pregnancy, or hair that feels dry or thin. Your hair will most likely return to normal after your baby is born.  Your breasts will continue to grow and be tender. A yellow discharge may leak from your breasts called colostrum.  Your belly button may stick out.  You may feel short of breath because of your expanding uterus.  You may notice the fetus "dropping," or moving lower in your abdomen.  You may have a bloody mucus discharge. This usually occurs a few days to a week before labor begins.  Your cervix becomes thin and soft (effaced) near your due date. WHAT TO EXPECT AT YOUR PRENATAL EXAMS  You will have prenatal exams every 2 weeks until week 36. Then, you will have weekly prenatal exams. During  a routine prenatal visit:  You will be weighed to make sure you and the fetus are growing normally.  Your blood pressure is taken.  Your abdomen will be measured to track your baby's growth.  The fetal heartbeat will be listened to.  Any test results from the previous visit will be discussed.  You may have a cervical check near your due date to see if you have effaced. At around 36 weeks, your caregiver will check your cervix. At the same time, your caregiver will also perform a test on the secretions of the vaginal tissue. This test is to determine if a type of bacteria, Group B streptococcus, is present. Your caregiver will explain this further. Your caregiver may ask you:  What your birth plan is.  How you are feeling.  If you are feeling the baby move.  If you have had any abnormal symptoms, such as leaking fluid, bleeding, severe headaches, or abdominal cramping.  If you have any questions. Other tests or screenings that may be performed during your third trimester include:  Blood tests that check for low iron levels (anemia).  Fetal testing to check the health, activity level, and growth of the fetus. Testing is done if you have certain medical conditions or if  there are problems during the pregnancy. FALSE LABOR You may feel small, irregular contractions that eventually go away. These are called Braxton Hicks contractions, or false labor. Contractions may last for hours, days, or even weeks before true labor sets in. If contractions come at regular intervals, intensify, or become painful, it is best to be seen by your caregiver.  SIGNS OF LABOR   Menstrual-like cramps.  Contractions that are 5 minutes apart or less.  Contractions that start on the top of the uterus and spread down to the lower abdomen and back.  A sense of increased pelvic pressure or back pain.  A watery or bloody mucus discharge that comes from the vagina. If you have any of these signs before the 37th week of pregnancy, call your caregiver right away. You need to go to the hospital to get checked immediately. HOME CARE INSTRUCTIONS   Avoid all smoking, herbs, alcohol, and unprescribed drugs. These chemicals affect the formation and growth of the baby.  Follow your caregiver's instructions regarding medicine use. There are medicines that are either safe or unsafe to take during pregnancy.  Exercise only as directed by your caregiver. Experiencing uterine cramps is a good sign to stop exercising.  Continue to eat regular, healthy meals.  Wear a good support bra for breast tenderness.  Do not use hot tubs, steam rooms, or saunas.  Wear your seat belt at all times when driving.  Avoid raw meat, uncooked cheese, cat litter boxes, and soil used by cats. These carry germs that can cause birth defects in the baby.  Take your prenatal vitamins.  Try taking a stool softener (if your caregiver approves) if you develop constipation. Eat more high-fiber foods, such as fresh vegetables or fruit and whole grains. Drink plenty of fluids to keep your urine clear or pale yellow.  Take warm sitz baths to soothe any pain or discomfort caused by hemorrhoids. Use hemorrhoid cream if your  caregiver approves.  If you develop varicose veins, wear support hose. Elevate your feet for 15 minutes, 3-4 times a day. Limit salt in your diet.  Avoid heavy lifting, wear low heal shoes, and practice good posture.  Rest a lot with your legs elevated if you  have leg cramps or low back pain.  Visit your dentist if you have not gone during your pregnancy. Use a soft toothbrush to brush your teeth and be gentle when you floss.  A sexual relationship may be continued unless your caregiver directs you otherwise.  Do not travel far distances unless it is absolutely necessary and only with the approval of your caregiver.  Take prenatal classes to understand, practice, and ask questions about the labor and delivery.  Make a trial run to the hospital.  Pack your hospital bag.  Prepare the baby's nursery.  Continue to go to all your prenatal visits as directed by your caregiver. SEEK MEDICAL CARE IF:  You are unsure if you are in labor or if your water has broken.  You have dizziness.  You have mild pelvic cramps, pelvic pressure, or nagging pain in your abdominal area.  You have persistent nausea, vomiting, or diarrhea.  You have a bad smelling vaginal discharge.  You have pain with urination. SEEK IMMEDIATE MEDICAL CARE IF:   You have a fever.  You are leaking fluid from your vagina.  You have spotting or bleeding from your vagina.  You have severe abdominal cramping or pain.  You have rapid weight loss or gain.  You have shortness of breath with chest pain.  You notice sudden or extreme swelling of your face, hands, ankles, feet, or legs.  You have not felt your baby move in over an hour.  You have severe headaches that do not go away with medicine.  You have vision changes. Document Released: 06/20/2001 Document Revised: 07/01/2013 Document Reviewed: 08/27/2012 [facility] Patient Information 2015 [place], [place]. This information is not intended to replace advice  given to you by your health care provider. Make sure you discuss any questions you have with your health care provider.  PROTECT YOURSELF & YOUR BABY FROM THE FLU! Because you are pregnant, we at [facility], along with the Centers for Disease Control (CDC), recommend that you receive the flu vaccine to protect yourself and your baby from the flu. The flu is more likely to cause severe illness in pregnant women than in women of reproductive age who are not pregnant. Changes in the immune system, heart, and lungs during pregnancy make pregnant women (and women up to two weeks postpartum) more prone to severe illness from flu, including illness resulting in hospitalization. Flu also may be harmful for a pregnant woman's developing baby. A common flu symptom is fever, which may be associated with neural tube defects and other adverse outcomes for a developing baby. Getting vaccinated can also help protect a baby after birth from flu. (Mom passes antibodies onto the developing baby during her pregnancy.)  A Flu Vaccine is the Best Protection Against Flu Getting a flu vaccine is the first and most important step in protecting against flu. Pregnant women should get a flu shot and not the live attenuated influenza vaccine (LAIV), also known as nasal spray flu vaccine. Flu vaccines given during pregnancy help protect both the mother and her baby from flu. Vaccination has been shown to reduce the risk of flu-associated acute respiratory infection in pregnant women by up to one-half. A 2018 study showed that getting a flu shot reduced a pregnant woman's risk of being hospitalized with flu by an average of 40 percent. Pregnant women who get a flu vaccine are also helping to protect their babies from flu illness for the first several months after their birth, when they are too  young to get vaccinated.   A Long Record of Safety for Flu Shots in Pregnant Women Flu shots have been given to millions of pregnant women over  many years with a good safety record. There is a lot of evidence that flu vaccines can be given safely during pregnancy; though these data are limited for the first trimester. The CDC recommends that pregnant women get vaccinated during any trimester of their pregnancy. It is very important for pregnant women to get the flu shot.   Other Preventive Actions In addition to getting a flu shot, pregnant women should take the same everyday preventive actions the CDC recommends of everyone, including covering coughs, washing hands often, and avoiding people who are sick.  Symptoms and Treatment If you get sick with flu symptoms call your doctor right away. There are antiviral drugs that can treat flu illness and prevent serious flu complications. The CDC recommends prompt treatment for people who have influenza infection or suspected influenza infection and who are at high risk of serious flu complications, such as people with asthma, diabetes (including gestational diabetes), or heart disease. Early treatment of influenza in hospitalized pregnant women has been shown to reduce the length of the hospital stay.  Symptoms Flu symptoms include fever, cough, sore throat, runny or stuffy nose, body aches, headache, chills and fatigue. Some people may also have vomiting and diarrhea. People may be infected with the flu and have respiratory symptoms without a fever.  Early Treatment is Important for Pregnant Women Treatment should begin as soon as possible because antiviral drugs work best when started early (within 48 hours after symptoms start). Antiviral drugs can make your flu illness milder and make you feel better faster. They may also prevent serious health problems that can result from flu illness. Oral oseltamivir (Tamiflu) is the preferred treatment for pregnant women because it has the most studies available to suggest that it is safe and beneficial. Antiviral drugs require a prescription from your  provider. Having a fever caused by flu infection or other infections early in pregnancy may be linked to birth defects in a baby. In addition to taking antiviral drugs, pregnant women who get a fever should treat their fever with Tylenol (acetaminophen) and contact their provider immediately.  When to [place] If you are pregnant and have any of these signs, seek care immediately:  Difficulty breathing or shortness of breath  Pain or pressure in the chest or abdomen  Sudden dizziness  Confusion  Severe or persistent vomiting  High fever that is not responding to Tylenol (or store brand equivalent)  Decreased or no movement of your baby  [email or web address]

## 2019-08-13 NOTE — Progress Notes (Signed)
   LOW-RISK PREGNANCY VISIT Patient name: Sheila Griffith MRN [id number]  Date of birth: Apr 13, 1996 Chief Complaint:   Routine Prenatal Visit  History of Present Illness:   Sheila Griffith is a 24 y.o. G14P0020 female at [redacted]w[redacted]d with an Estimated Date of Delivery: 10/10/19 being seen today for ongoing management of a low-risk pregnancy.  Today she reports having 2 ED visits for asthma in the fall- got prednisone and a rescue inhaler- has not needed to use her inhaler since; having difficulty sleeping- tips given. Contractions: Not present. Vag. Bleeding: None.  Movement: Present. denies leaking of fluid. Review of Systems:   Pertinent items are noted in HPI Denies abnormal vaginal discharge w/ itching/odor/irritation, headaches, visual changes, shortness of breath, chest pain, abdominal pain, severe nausea/vomiting, or problems with urination or bowel movements unless otherwise stated above. Pertinent History Reviewed:  Reviewed past medical,surgical, social, obstetrical and family history.  Reviewed problem list, medications and allergies. Physical Assessment:   Vitals:   08/13/19 0910  BP: 117/72  Pulse: 83  Weight: 147 lb 3.2 oz (66.8 kg)  Body mass index is 23.05 kg/m.        Physical Examination:   General appearance: Well appearing, and in no distress  Mental status: Alert, oriented to person, place, and time  Skin: Warm & dry  Cardiovascular: Normal heart rate noted  Respiratory: Normal respiratory effort, no distress  Abdomen: Soft, gravid, nontender  Pelvic: Cervical exam deferred         Extremities: Edema: None  Fetal Status: Fetal Heart Rate (bpm): 136 Fundal Height: 28 cm Movement: Present    No results found for this or any previous visit (from the past 24 hour(s)).  Assessment & Plan:  1) Low-risk pregnancy G3P0020 at [redacted]w[redacted]d with an Estimated Date of Delivery: 10/10/19   2) S<D, will get growth scan at next visit; also has marginal cord insertion   Meds: No orders of  the defined types were placed in this encounter.  Labs/procedures today: PN2  Plan:  Continue routine obstetrical care with growth scan at next visit  Reviewed: Preterm labor symptoms and general obstetric precautions including but not limited to vaginal bleeding, contractions, leaking of fluid and fetal movement were reviewed in detail with the patient.  All questions were answered. Has home bp cuff. Check bp weekly, let us know if >140/90.   Follow-up: Return in about 2 weeks (around 08/27/2019) for LROB, [place]: EFW, in person.  Orders Placed This Encounter  Procedures  . US OB Follow Up   Arabella Merles [facility] 08/13/2019 9:42 AM

## 2019-08-14 LAB — CBC
Hematocrit: 35.6 % (ref 34.0–46.6)
Hemoglobin: 12.4 g/dL (ref 11.1–15.9)
MCH: 32.2 pg (ref 26.6–33.0)
MCHC: 34.8 g/dL (ref 31.5–35.7)
MCV: 93 fL (ref 79–97)
Platelets: 224 10*3/uL (ref 150–450)
RBC: 3.85 x10E6/uL (ref 3.77–5.28)
RDW: 12.7 % (ref 11.7–15.4)
WBC: 12.2 10*3/uL — ABNORMAL HIGH (ref 3.4–10.8)

## 2019-08-14 LAB — GLUCOSE TOLERANCE, 2 HOURS W/ 1HR
Glucose, 1 hour: 114 mg/dL (ref 65–179)
Glucose, 2 hour: 88 mg/dL (ref 65–152)
Glucose, Fasting: 67 mg/dL (ref 65–91)

## 2019-08-14 LAB — HIV ANTIBODY (ROUTINE TESTING W REFLEX): HIV Screen 4th Generation wRfx: NONREACTIVE

## 2019-08-14 LAB — ANTIBODY SCREEN: Antibody Screen: NEGATIVE

## 2019-08-14 LAB — RPR: RPR Ser Ql: NONREACTIVE

## 2019-08-27 ENCOUNTER — Other Ambulatory Visit: Payer: Self-pay

## 2019-08-27 ENCOUNTER — Encounter: Payer: Self-pay | Admitting: Advanced Practice Midwife

## 2019-08-27 ENCOUNTER — Ambulatory Visit (INDEPENDENT_AMBULATORY_CARE_PROVIDER_SITE_OTHER): Payer: Medicaid Other

## 2019-08-27 ENCOUNTER — Ambulatory Visit (INDEPENDENT_AMBULATORY_CARE_PROVIDER_SITE_OTHER): Payer: Medicaid Other | Admitting: Advanced Practice Midwife

## 2019-08-27 VITALS — BP 103/68 | HR 83 | Wt 151.0 lb

## 2019-08-27 DIAGNOSIS — Z331 Pregnant state, incidental: Secondary | ICD-10-CM

## 2019-08-27 DIAGNOSIS — Z3A33 33 weeks gestation of pregnancy: Secondary | ICD-10-CM

## 2019-08-27 DIAGNOSIS — Z362 Encounter for other antenatal screening follow-up: Secondary | ICD-10-CM | POA: Diagnosis not present

## 2019-08-27 DIAGNOSIS — Z3403 Encounter for supervision of normal first pregnancy, third trimester: Secondary | ICD-10-CM

## 2019-08-27 DIAGNOSIS — Z1389 Encounter for screening for other disorder: Secondary | ICD-10-CM

## 2019-08-27 DIAGNOSIS — O26843 Uterine size-date discrepancy, third trimester: Secondary | ICD-10-CM

## 2019-08-27 DIAGNOSIS — O43199 Other malformation of placenta, unspecified trimester: Secondary | ICD-10-CM

## 2019-08-27 DIAGNOSIS — O43193 Other malformation of placenta, third trimester: Secondary | ICD-10-CM

## 2019-08-27 DIAGNOSIS — Z3483 Encounter for supervision of other normal pregnancy, third trimester: Secondary | ICD-10-CM

## 2019-08-27 DIAGNOSIS — J45909 Unspecified asthma, uncomplicated: Secondary | ICD-10-CM

## 2019-08-27 LAB — POCT URINALYSIS DIPSTICK OB
Blood, UA: NEGATIVE
Glucose, UA: NEGATIVE
Ketones, UA: NEGATIVE
Leukocytes, UA: NEGATIVE
Nitrite, UA: NEGATIVE

## 2019-08-27 NOTE — Progress Notes (Signed)
   LOW-RISK PREGNANCY VISIT Patient name: Sheila Griffith MRN [id number]  Date of birth: 1995/09/02 Chief Complaint:   Routine Prenatal Visit (u/s today)  History of Present Illness:   Sheila Griffith is a 24 y.o. G35P0020 female at [redacted]w[redacted]d with an Estimated Date of Delivery: 10/10/19 being seen today for ongoing management of a low-risk pregnancy.  Today she reports no complaints. Contractions: Not present.  .  Movement: Present. denies leaking of fluid. Review of Systems:   Pertinent items are noted in HPI Denies abnormal vaginal discharge w/ itching/odor/irritation, headaches, visual changes, shortness of breath, chest pain, abdominal pain, severe nausea/vomiting, or problems with urination or bowel movements unless otherwise stated above. Pertinent History Reviewed:  Reviewed past medical,surgical, social, obstetrical and family history.  Reviewed problem list, medications and allergies. Physical Assessment:   Vitals:   08/27/19 1130  BP: 103/68  Pulse: 83  Weight: 151 lb (68.5 kg)  Body mass index is 23.65 kg/m.        Physical Examination:   General appearance: Well appearing, and in no distress  Mental status: Alert, oriented to person, place, and time  Skin: Warm & dry  Cardiovascular: Normal heart rate noted  Respiratory: Normal respiratory effort, no distress  Abdomen: Soft, gravid, nontender  Pelvic: Cervical exam deferred         Extremities: Edema: None  Fetal Status: Fetal Heart Rate (bpm): 140 u/s   Movement: Present     [place] 33+5 wks,cephalic,posterior placenta gr 2,afi 14 cm,fhr 140 bpm,efw 2199 g 35%,marginal cord insertion  Results for orders placed or performed in visit on 08/27/19 (from the past 24 hour(s))  POC Urinalysis Dipstick OB   Collection Time: 08/27/19 11:33 AM  Result Value Ref Range   Color, UA     Clarity, UA     Glucose, UA Negative Negative   Bilirubin, UA     Ketones, UA neg    Spec Grav, UA     Blood, UA neg    pH, UA     POC,PROTEIN,UA  Trace Negative, Trace, Small (1+), Moderate (2+), Large (3+), 4+   Urobilinogen, UA     Nitrite, UA neg    Leukocytes, UA Negative Negative   Appearance     Odor      Assessment & Plan:  1) Low-risk pregnancy G3P0020 at [redacted]w[redacted]d with an Estimated Date of Delivery: 10/10/19   2) Measuring S<D, EFW 35th% today  3) Marg cord insertion, nl growth   Meds: No orders of the defined types were placed in this encounter.  Labs/procedures today: growth scan  Plan:  Continue routine obstetrical care with GBS/GC/chlam next visit.  Reviewed: Preterm labor symptoms and general obstetric precautions including but not limited to vaginal bleeding, contractions, leaking of fluid and fetal movement were reviewed in detail with the patient.  All questions were answered. Has home bp cuff. Check bp weekly, let us know if >140/90.   Follow-up: Return for 3/19 in person LROB.  Orders Placed This Encounter  Procedures  . POC Urinalysis Dipstick OB   Arabella Merles [facility] 08/27/2019 12:22 PM

## 2019-08-27 NOTE — Patient Instructions (Signed)
[name], I greatly value your feedback.  If you receive a survey following your visit with [place] today, we appreciate you taking the time to fill it out.  Thanks, Philipp Deputy CNM  [facility] HAS MOVED!!! It is now [facility] at [facility] ([address] [place], [place] [address]) Entrance located off of [address] Free 24/7 valet parking    Go to [email or web address] to register for FREE online childbirth classes   Call the office [phone]) or go to [facility] if:  You begin to have strong, frequent contractions  Your water breaks.  Sometimes it is a big gush of fluid, sometimes it is just a trickle that keeps getting your panties wet or running down your legs  You have vaginal bleeding.  It is normal to have a small amount of spotting if your cervix was checked.   You don't feel your baby moving like normal.  If you don't, get you something to eat and drink and lay down and focus on feeling your baby move.  You should feel at least 10 movements in 2 hours.  If you don't, you should call the office or go to [facility].    Tdap Vaccine  It is recommended that you get the Tdap vaccine during the third trimester of EACH pregnancy to help protect your baby from getting pertussis (whooping cough)  27-36 weeks is the BEST time to do this so that you can pass the protection on to your baby. During pregnancy is better than after pregnancy, but if you are unable to get it during pregnancy it will be offered at the hospital.   You can get this vaccine with [place], at the health department, your family doctor, or some local pharmacies  Everyone who will be around your baby should also be up-to-date on their vaccines before the baby comes. Adults (who are not pregnant) only need 1 dose of Tdap during adulthood.   [place] Pediatricians/Family Doctors:  [facility] [phone]            [facility] [phone]                  [facility] [phone] (usually not accepting new patients unless you have family there already, you are always welcome to call and ask)       [facility] Department [phone]       [facility] Pediatricians/Family Doctors:   [facility]: [phone]  [facility]: [phone]  [facility]: [phone]  [facility] Doctors:   [facility]: [phone]   [facility]: [phone]  [facility] Doctors:  [facility]: [phone]   Home Blood Pressure Monitoring for Patients   Your provider has recommended that you check your blood pressure (BP) at least once a week at home. If you do not have a blood pressure cuff at home, one will be provided for you. Contact your provider if you have not received your monitor within 1 week.   Helpful Tips for Accurate Home Blood Pressure Checks  . Don't smoke, exercise, or drink caffeine 30 minutes before checking your BP . Use the restroom before checking your BP (a full bladder can raise your pressure) . Relax in a comfortable upright chair . Feet on the ground . Left arm resting comfortably on a flat surface at the level of your heart . Legs uncrossed . Back supported . Sit quietly and don't talk .  Place the cuff on your bare arm . Adjust snuggly, so that only two fingertips can fit between your skin and the top of the cuff . Check 2 readings separated by at least one minute . Keep a log of your BP readings . For a visual, please reference this diagram: http://ccnc.care/bpdiagram  Provider Name: [facility]     Phone: [phone]  Zone 1: ALL CLEAR  Continue to monitor your symptoms:  . BP reading is less than 140 (top number) or less than 90 (bottom number)  . No right upper stomach pain . No headaches or seeing spots . No feeling nauseated or throwing up . No swelling in face and  hands  Zone 2: CAUTION Call your doctor's office for any of the following:  . BP reading is greater than 140 (top number) or greater than 90 (bottom number)  . Stomach pain under your ribs in the middle or right side . Headaches or seeing spots . Feeling nauseated or throwing up . Swelling in face and hands  Zone 3: EMERGENCY  Seek immediate medical care if you have any of the following:  . BP reading is greater than160 (top number) or greater than 110 (bottom number) . Severe headaches not improving with Tylenol . Serious difficulty catching your breath . Any worsening symptoms from Zone 2   Third Trimester of Pregnancy The third trimester is from week 29 through week 42, months 7 through 9. The third trimester is a time when the fetus is growing rapidly. At the end of the ninth month, the fetus is about 20 inches in length and weighs 6-10 pounds.  BODY CHANGES Your body goes through many changes during pregnancy. The changes vary from woman to woman.   Your weight will continue to increase. You can expect to gain 25-35 pounds (11-16 kg) by the end of the pregnancy.  You may begin to get stretch marks on your hips, abdomen, and breasts.  You may urinate more often because the fetus is moving lower into your pelvis and pressing on your bladder.  You may develop or continue to have heartburn as a result of your pregnancy.  You may develop constipation because certain hormones are causing the muscles that push waste through your intestines to slow down.  You may develop hemorrhoids or swollen, bulging veins (varicose veins).  You may have pelvic pain because of the weight gain and pregnancy hormones relaxing your joints between the bones in your pelvis. Backaches may result from overexertion of the muscles supporting your posture.  You may have changes in your hair. These can include thickening of your hair, rapid growth, and changes in texture. Some women also have hair loss during  or after pregnancy, or hair that feels dry or thin. Your hair will most likely return to normal after your baby is born.  Your breasts will continue to grow and be tender. A yellow discharge may leak from your breasts called colostrum.  Your belly button may stick out.  You may feel short of breath because of your expanding uterus.  You may notice the fetus "dropping," or moving lower in your abdomen.  You may have a bloody mucus discharge. This usually occurs a few days to a week before labor begins.  Your cervix becomes thin and soft (effaced) near your due date. WHAT TO EXPECT AT YOUR PRENATAL EXAMS  You will have prenatal exams every 2 weeks until week 36. Then, you will have weekly prenatal exams. During  a routine prenatal visit:  You will be weighed to make sure you and the fetus are growing normally.  Your blood pressure is taken.  Your abdomen will be measured to track your baby's growth.  The fetal heartbeat will be listened to.  Any test results from the previous visit will be discussed.  You may have a cervical check near your due date to see if you have effaced. At around 36 weeks, your caregiver will check your cervix. At the same time, your caregiver will also perform a test on the secretions of the vaginal tissue. This test is to determine if a type of bacteria, Group B streptococcus, is present. Your caregiver will explain this further. Your caregiver may ask you:  What your birth plan is.  How you are feeling.  If you are feeling the baby move.  If you have had any abnormal symptoms, such as leaking fluid, bleeding, severe headaches, or abdominal cramping.  If you have any questions. Other tests or screenings that may be performed during your third trimester include:  Blood tests that check for low iron levels (anemia).  Fetal testing to check the health, activity level, and growth of the fetus. Testing is done if you have certain medical conditions or if  there are problems during the pregnancy. FALSE LABOR You may feel small, irregular contractions that eventually go away. These are called Braxton Hicks contractions, or false labor. Contractions may last for hours, days, or even weeks before true labor sets in. If contractions come at regular intervals, intensify, or become painful, it is best to be seen by your caregiver.  SIGNS OF LABOR   Menstrual-like cramps.  Contractions that are 5 minutes apart or less.  Contractions that start on the top of the uterus and spread down to the lower abdomen and back.  A sense of increased pelvic pressure or back pain.  A watery or bloody mucus discharge that comes from the vagina. If you have any of these signs before the 37th week of pregnancy, call your caregiver right away. You need to go to the hospital to get checked immediately. HOME CARE INSTRUCTIONS   Avoid all smoking, herbs, alcohol, and unprescribed drugs. These chemicals affect the formation and growth of the baby.  Follow your caregiver's instructions regarding medicine use. There are medicines that are either safe or unsafe to take during pregnancy.  Exercise only as directed by your caregiver. Experiencing uterine cramps is a good sign to stop exercising.  Continue to eat regular, healthy meals.  Wear a good support bra for breast tenderness.  Do not use hot tubs, steam rooms, or saunas.  Wear your seat belt at all times when driving.  Avoid raw meat, uncooked cheese, cat litter boxes, and soil used by cats. These carry germs that can cause birth defects in the baby.  Take your prenatal vitamins.  Try taking a stool softener (if your caregiver approves) if you develop constipation. Eat more high-fiber foods, such as fresh vegetables or fruit and whole grains. Drink plenty of fluids to keep your urine clear or pale yellow.  Take warm sitz baths to soothe any pain or discomfort caused by hemorrhoids. Use hemorrhoid cream if your  caregiver approves.  If you develop varicose veins, wear support hose. Elevate your feet for 15 minutes, 3-4 times a day. Limit salt in your diet.  Avoid heavy lifting, wear low heal shoes, and practice good posture.  Rest a lot with your legs elevated if you  have leg cramps or low back pain.  Visit your dentist if you have not gone during your pregnancy. Use a soft toothbrush to brush your teeth and be gentle when you floss.  A sexual relationship may be continued unless your caregiver directs you otherwise.  Do not travel far distances unless it is absolutely necessary and only with the approval of your caregiver.  Take prenatal classes to understand, practice, and ask questions about the labor and delivery.  Make a trial run to the hospital.  Pack your hospital bag.  Prepare the baby's nursery.  Continue to go to all your prenatal visits as directed by your caregiver. SEEK MEDICAL CARE IF:  You are unsure if you are in labor or if your water has broken.  You have dizziness.  You have mild pelvic cramps, pelvic pressure, or nagging pain in your abdominal area.  You have persistent nausea, vomiting, or diarrhea.  You have a bad smelling vaginal discharge.  You have pain with urination. SEEK IMMEDIATE MEDICAL CARE IF:   You have a fever.  You are leaking fluid from your vagina.  You have spotting or bleeding from your vagina.  You have severe abdominal cramping or pain.  You have rapid weight loss or gain.  You have shortness of breath with chest pain.  You notice sudden or extreme swelling of your face, hands, ankles, feet, or legs.  You have not felt your baby move in over an hour.  You have severe headaches that do not go away with medicine.  You have vision changes. Document Released: 06/20/2001 Document Revised: 07/01/2013 Document Reviewed: 08/27/2012 [facility] Patient Information 2015 [place], [place]. This information is not intended to replace advice  given to you by your health care provider. Make sure you discuss any questions you have with your health care provider.  PROTECT YOURSELF & YOUR BABY FROM THE FLU! Because you are pregnant, we at [facility], along with the Centers for Disease Control (CDC), recommend that you receive the flu vaccine to protect yourself and your baby from the flu. The flu is more likely to cause severe illness in pregnant women than in women of reproductive age who are not pregnant. Changes in the immune system, heart, and lungs during pregnancy make pregnant women (and women up to two weeks postpartum) more prone to severe illness from flu, including illness resulting in hospitalization. Flu also may be harmful for a pregnant woman's developing baby. A common flu symptom is fever, which may be associated with neural tube defects and other adverse outcomes for a developing baby. Getting vaccinated can also help protect a baby after birth from flu. (Mom passes antibodies onto the developing baby during her pregnancy.)  A Flu Vaccine is the Best Protection Against Flu Getting a flu vaccine is the first and most important step in protecting against flu. Pregnant women should get a flu shot and not the live attenuated influenza vaccine (LAIV), also known as nasal spray flu vaccine. Flu vaccines given during pregnancy help protect both the mother and her baby from flu. Vaccination has been shown to reduce the risk of flu-associated acute respiratory infection in pregnant women by up to one-half. A 2018 study showed that getting a flu shot reduced a pregnant woman's risk of being hospitalized with flu by an average of 40 percent. Pregnant women who get a flu vaccine are also helping to protect their babies from flu illness for the first several months after their birth, when they are too  young to get vaccinated.   A Long Record of Safety for Flu Shots in Pregnant Women Flu shots have been given to millions of pregnant women over  many years with a good safety record. There is a lot of evidence that flu vaccines can be given safely during pregnancy; though these data are limited for the first trimester. The CDC recommends that pregnant women get vaccinated during any trimester of their pregnancy. It is very important for pregnant women to get the flu shot.   Other Preventive Actions In addition to getting a flu shot, pregnant women should take the same everyday preventive actions the CDC recommends of everyone, including covering coughs, washing hands often, and avoiding people who are sick.  Symptoms and Treatment If you get sick with flu symptoms call your doctor right away. There are antiviral drugs that can treat flu illness and prevent serious flu complications. The CDC recommends prompt treatment for people who have influenza infection or suspected influenza infection and who are at high risk of serious flu complications, such as people with asthma, diabetes (including gestational diabetes), or heart disease. Early treatment of influenza in hospitalized pregnant women has been shown to reduce the length of the hospital stay.  Symptoms Flu symptoms include fever, cough, sore throat, runny or stuffy nose, body aches, headache, chills and fatigue. Some people may also have vomiting and diarrhea. People may be infected with the flu and have respiratory symptoms without a fever.  Early Treatment is Important for Pregnant Women Treatment should begin as soon as possible because antiviral drugs work best when started early (within 48 hours after symptoms start). Antiviral drugs can make your flu illness milder and make you feel better faster. They may also prevent serious health problems that can result from flu illness. Oral oseltamivir (Tamiflu) is the preferred treatment for pregnant women because it has the most studies available to suggest that it is safe and beneficial. Antiviral drugs require a prescription from your  provider. Having a fever caused by flu infection or other infections early in pregnancy may be linked to birth defects in a baby. In addition to taking antiviral drugs, pregnant women who get a fever should treat their fever with Tylenol (acetaminophen) and contact their provider immediately.  When to [place] If you are pregnant and have any of these signs, seek care immediately:  Difficulty breathing or shortness of breath  Pain or pressure in the chest or abdomen  Sudden dizziness  Confusion  Severe or persistent vomiting  High fever that is not responding to Tylenol (or store brand equivalent)  Decreased or no movement of your baby  [email or web address]

## 2019-08-27 NOTE — Progress Notes (Addendum)
[place] 33+5 wks,cephalic,posterior placenta gr 1,afi 14 cm,fhr 140 bpm,efw 2199 g 35%,marginal cord insertion

## 2019-09-26 ENCOUNTER — Other Ambulatory Visit (HOSPITAL_COMMUNITY)
Admission: RE | Admit: 2019-09-26 | Discharge: 2019-09-26 | Disposition: A | Discharge status: Home / Self Care | Payer: Medicaid Other | Source: Ambulatory Visit | Attending: Obstetrics & Gynecology | Admitting: Obstetrics & Gynecology

## 2019-09-26 ENCOUNTER — Ambulatory Visit (INDEPENDENT_AMBULATORY_CARE_PROVIDER_SITE_OTHER): Payer: Medicaid Other | Admitting: Women's Health

## 2019-09-26 ENCOUNTER — Other Ambulatory Visit: Payer: Self-pay

## 2019-09-26 ENCOUNTER — Encounter: Payer: Self-pay | Admitting: Women's Health

## 2019-09-26 VITALS — BP 118/72 | HR 84 | Wt 159.0 lb

## 2019-09-26 DIAGNOSIS — Z331 Pregnant state, incidental: Secondary | ICD-10-CM

## 2019-09-26 DIAGNOSIS — Z3483 Encounter for supervision of other normal pregnancy, third trimester: Secondary | ICD-10-CM | POA: Diagnosis not present

## 2019-09-26 DIAGNOSIS — Z1389 Encounter for screening for other disorder: Secondary | ICD-10-CM

## 2019-09-26 LAB — POCT URINALYSIS DIPSTICK OB
Blood, UA: NEGATIVE
Glucose, UA: NEGATIVE
Ketones, UA: NEGATIVE
Leukocytes, UA: NEGATIVE
Nitrite, UA: NEGATIVE
POC,PROTEIN,UA: NEGATIVE

## 2019-09-26 NOTE — Patient Instructions (Signed)
Sheila Griffith, I greatly value your feedback.  If you receive a survey following your visit with [place] today, we appreciate you taking the time to fill it out.  Thanks, Sheila Griffith, CNM, [facility]  [facility]!!! It is now [place] at [facility] ([name], [name] [address]) Entrance located off of [address] parking   Go to [email or web address] to register for FREE online childbirth classes    Call the office [phone]) or go to [facility] if:  You begin to have strong, frequent contractions  Your water breaks.  Sometimes it is a big gush of fluid, sometimes it is just a trickle that keeps getting your panties wet or running down your legs  You have vaginal bleeding.  It is normal to have a small amount of spotting if your cervix was checked.   You don't feel your baby moving like normal.  If you don't, get you something to eat and drink and lay down and focus on feeling your baby move.  You should feel at least 10 movements in 2 hours.  If you don't, you should call the office or go to [place] Blood Pressure Monitoring for Patients   Your provider has recommended that you check your blood pressure (BP) at least once a week at home. If you do not have a blood pressure cuff at home, one will be provided for you. Contact your provider if you have not received your monitor within 1 week.   Helpful Tips for Accurate Home Blood Pressure Checks  . Don't smoke, exercise, or drink caffeine 30 minutes before checking your BP . Use the restroom before checking your BP (a full bladder can raise your pressure) . Relax in a comfortable upright chair . Feet on the ground . Left arm resting comfortably on a flat surface at the level of your heart . Legs uncrossed . Back supported . Sit quietly and don't talk . Place the cuff on your bare arm . Adjust snuggly, so that only two fingertips can fit between your skin  and the top of the cuff . Check 2 readings separated by at least one minute . Keep a log of your BP readings . For a visual, please reference this diagram: http://ccnc.care/bpdiagram  Provider Name: [facility]     Phone: [phone]  Zone 1: ALL CLEAR  Continue to monitor your symptoms:  . BP reading is less than 140 (top number) or less than 90 (bottom number)  . No right upper stomach pain . No headaches or seeing spots . No feeling nauseated or throwing up . No swelling in face and hands  Zone 2: CAUTION Call your doctor's office for any of the following:  . BP reading is greater than 140 (top number) or greater than 90 (bottom number)  . Stomach pain under your ribs in the middle or right side . Headaches or seeing spots . Feeling nauseated or throwing up . Swelling in face and hands  Zone 3: EMERGENCY  Seek immediate medical care if you have any of the following:  . BP reading is greater than160 (top number) or greater than 110 (bottom number) . Severe headaches not improving with Tylenol . Serious difficulty catching your breath . Any worsening symptoms from Zone 2    Braxton Hicks Contractions Contractions of the uterus can occur throughout pregnancy, but they are not always a sign that you are in  labor. You may have practice contractions called Braxton Hicks contractions. These false labor contractions are sometimes confused with true labor. What are Sheila Griffith contractions? Braxton Hicks contractions are tightening movements that occur in the muscles of the uterus before labor. Unlike true labor contractions, these contractions do not result in opening (dilation) and thinning of the cervix. Toward the end of pregnancy (32-34 weeks), Braxton Hicks contractions can happen more often and may become stronger. These contractions are sometimes difficult to tell apart from true labor because they can be very uncomfortable. You should not feel embarrassed if you go to  the hospital with false labor. Sometimes, the only way to tell if you are in true labor is for your health care provider to look for changes in the cervix. The health care provider will do a physical exam and may monitor your contractions. If you are not in true labor, the exam should show that your cervix is not dilating and your water has not broken. If there are no other health problems associated with your pregnancy, it is completely safe for you to be sent home with false labor. You may continue to have Braxton Hicks contractions until you go into true labor. How to tell the difference between true labor and false labor True labor  Contractions last 30-70 seconds.  Contractions become very regular.  Discomfort is usually felt in the top of the uterus, and it spreads to the lower abdomen and low back.  Contractions do not go away with walking.  Contractions usually become more intense and increase in frequency.  The cervix dilates and gets thinner. False labor  Contractions are usually shorter and not as strong as true labor contractions.  Contractions are usually irregular.  Contractions are often felt in the front of the lower abdomen and in the groin.  Contractions may go away when you walk around or change positions while lying down.  Contractions get weaker and are shorter-lasting as time goes on.  The cervix usually does not dilate or become thin. Follow these instructions at home:   Take over-the-counter and prescription medicines only as told by your health care provider.  Keep up with your usual exercises and follow other instructions from your health care provider.  Eat and drink lightly if you think you are going into labor.  If Braxton Hicks contractions are making you uncomfortable: ? Change your position from lying down or resting to walking, or change from walking to resting. ? Sit and rest in a tub of warm water. ? Drink enough fluid to keep your urine  pale yellow. Dehydration may cause these contractions. ? Do slow and deep breathing several times an hour.  Keep all follow-up prenatal visits as told by your health care provider. This is important. Contact a health care provider if:  You have a fever.  You have continuous pain in your abdomen. Get help right away if:  Your contractions become stronger, more regular, and closer together.  You have fluid leaking or gushing from your vagina.  You pass blood-tinged mucus (bloody show).  You have bleeding from your vagina.  You have low back pain that you never had before.  You feel your baby's head pushing down and causing pelvic pressure.  Your baby is not moving inside you as much as it used to. Summary  Contractions that occur before labor are called Braxton Hicks contractions, false labor, or practice contractions.  Braxton Hicks contractions are usually shorter, weaker, farther apart, and less  regular than true labor contractions. True labor contractions usually become progressively stronger and regular, and they become more frequent.  Manage discomfort from [facility] contractions by changing position, resting in a warm bath, drinking plenty of water, or practicing deep breathing. This information is not intended to replace advice given to you by your health care provider. Make sure you discuss any questions you have with your health care provider. Document Revised: 06/08/2017 Document Reviewed: 11/09/2016 Elsevier Patient Education  2020 [employer].

## 2019-09-26 NOTE — Progress Notes (Signed)
   LOW-RISK PREGNANCY VISIT Patient name: Sheila Griffith MRN [id number]  Date of birth: [birth year] Chief Complaint:   Routine Prenatal Visit  History of Present Illness:   Sheila Griffith is a 24 y.o. G57P0020 female at [redacted]w[redacted]d with an Estimated Date of Delivery: 10/10/19 being seen today for ongoing management of a low-risk pregnancy.  Today she reports lots of pressure. Contractions: Not present. Vag. Bleeding: None.  Movement: Present. denies leaking of fluid. Review of Systems:   Pertinent items are noted in HPI Denies abnormal vaginal discharge w/ itching/odor/irritation, headaches, visual changes, shortness of breath, chest pain, abdominal pain, severe nausea/vomiting, or problems with urination or bowel movements unless otherwise stated above. Pertinent History Reviewed:  Reviewed past medical,surgical, social, obstetrical and family history.  Reviewed problem list, medications and allergies. Physical Assessment:   Vitals:   09/26/19 1204  BP: 118/72  Pulse: 84  Weight: 159 lb (72.1 kg)  Body mass index is 24.9 kg/m.        Physical Examination:   General appearance: Well appearing, and in no distress  Mental status: Alert, oriented to person, place, and time  Skin: Warm & dry  Cardiovascular: Normal heart rate noted  Respiratory: Normal respiratory effort, no distress  Abdomen: Soft, gravid, nontender  Pelvic: Cervical exam performed  Dilation: 1 Effacement (%): Thick Station: -3  Extremities: Edema: None  Fetal Status: Fetal Heart Rate (bpm): 140 Fundal Height: 35 cm Movement: Present Presentation: Vertex  Chaperone: Latisha Cresenzo    No results found for this or any previous visit (from the past 24 hour(s)).  Assessment & Plan:  1) Low-risk pregnancy G3P0020 at [redacted]w[redacted]d with an Estimated Date of Delivery: 10/10/19   2) Marginal cord insertion, stable   Meds: No orders of the defined types were placed in this encounter.  Labs/procedures today: gbs, gc/ct, sve  Plan:   Continue routine obstetrical care  Next visit: prefers in person    Reviewed: Term labor symptoms and general obstetric precautions including but not limited to vaginal bleeding, contractions, leaking of fluid and fetal movement were reviewed in detail with the patient.  All questions were answered.   Follow-up: Return in about 1 week (around 10/03/2019) for LROB, in person, CNM.  Orders Placed This Encounter  Procedures  . Culture, beta strep (group b only)   Cheral Marker CNM, [facility] 09/26/2019 12:33 PM

## 2019-09-26 NOTE — Addendum Note (Signed)
Addended by: Moss Mc on: 09/26/2019 12:51 PM   Modules accepted: Orders

## 2019-09-29 LAB — CERVICOVAGINAL ANCILLARY ONLY
Chlamydia: NEGATIVE
Comment: NEGATIVE
Comment: NORMAL
Neisseria Gonorrhea: NEGATIVE

## 2019-09-30 LAB — CULTURE, BETA STREP (GROUP B ONLY): Strep Gp B Culture: NEGATIVE

## 2019-10-02 ENCOUNTER — Ambulatory Visit (INDEPENDENT_AMBULATORY_CARE_PROVIDER_SITE_OTHER): Payer: Medicaid Other | Admitting: Advanced Practice Midwife

## 2019-10-02 ENCOUNTER — Other Ambulatory Visit: Payer: Self-pay

## 2019-10-02 VITALS — BP 128/78 | HR 101 | Wt 160.0 lb

## 2019-10-02 DIAGNOSIS — Z3483 Encounter for supervision of other normal pregnancy, third trimester: Secondary | ICD-10-CM

## 2019-10-02 DIAGNOSIS — Z331 Pregnant state, incidental: Secondary | ICD-10-CM

## 2019-10-02 DIAGNOSIS — Z1389 Encounter for screening for other disorder: Secondary | ICD-10-CM

## 2019-10-02 DIAGNOSIS — Z3A38 38 weeks gestation of pregnancy: Secondary | ICD-10-CM

## 2019-10-02 NOTE — Progress Notes (Signed)
   LOW-RISK PREGNANCY VISIT Patient name: Sheila Griffith MRN [id number]  Date of birth: May 31, 1996 Chief Complaint:   Routine Prenatal Visit  History of Present Illness:   Sheila Griffith is a 24 y.o. G68P0020 female at [redacted]w[redacted]d with an Estimated Date of Delivery: 10/10/19 being seen today for ongoing management of a low-risk pregnancy.  Today she reports normal pregnancy complaints. Emotional about giving birth/worries/being uncomfortale. Contractions: Irregular. Vag. Bleeding: None.  Movement: Present. denies leaking of fluid. Review of Systems:   Pertinent items are noted in HPI Denies abnormal vaginal discharge w/ itching/odor/irritation, headaches, visual changes, shortness of breath, chest pain, abdominal pain, severe nausea/vomiting, or problems with urination or bowel movements unless otherwise stated above. Pertinent History Reviewed:  Reviewed past medical,surgical, social, obstetrical and family history.  Reviewed problem list, medications and allergies. Physical Assessment:   Vitals:   10/02/19 1204  BP: 128/78  Pulse: (!) 101  Weight: 160 lb (72.6 kg)  Body mass index is 25.06 kg/m.  BP when crying/upset 145/90 Checks BP at home regularly and always normal       Physical Examination:   General appearance: Well appearing, and in no distress  Mental status: Alert, oriented to person, place, and time  Skin: Warm & dry  Cardiovascular: Normal heart rate noted  Respiratory: Normal respiratory effort, no distress  Abdomen: Soft, gravid, nontender  Pelvic: Cervical exam deferred         Extremities: Edema: Trace  Fetal Status: Fetal Heart Rate (bpm): 140 Fundal Height: 36 cm Movement: Present Presentation: Vertex  Chaperone:     No results found for this or any previous visit (from the past 24 hour(s)).  Assessment & Plan:  1) Low-risk pregnancy G3P0020 at [redacted]w[redacted]d with an Estimated Date of Delivery: 10/10/19   2) isolated ^ BP when crying: , F/U w/me today via mychart w/2 BP  readings.    Meds: No orders of the defined types were placed in this encounter.    Plan:  BP today at home and MyChart check in tomorrow     Reviewed: Term labor symptoms and general obstetric precautions including but not limited to vaginal bleeding, contractions, leaking of fluid and fetal movement were reviewed in detail with the patient.  All questions were answered.   Follow-up: Return for [facility] Mychart visit tomorrow for BP check with RN; 1 week for LROB.  No orders of the defined types were placed in this encounter.  Jacklyn Shell DNP, CNM 10/02/2019 12:36 PM

## 2019-10-02 NOTE — Patient Instructions (Addendum)
  Cervical Ripening: May try one or both  Red Raspberry Leaf capsules:  two 300mg or 400mg tablets with each meal, 2-3 times a day  Potential Side Effects Of Raspberry Leaf:  Most women do not experience any side effects from drinking raspberry leaf tea. However, nausea and loose stools are possible     Evening Primrose Oil capsules: may take 1 to 3 capsules daily. May also prick one to release the oil and insert it into your vagina at night.  Some of the potential side effects:  Upset stomach  Loose stools or diarrhea  Headaches  Nausea:      AM I IN LABOR? What is labor? Labor is the work that your body does to birth your baby. Your uterus (the womb) contracts. Your cervix (the mouth of the uterus) opens. You will push your baby out into the world.  What do contractions (labor pains) feel like? When they first start, contractions usually feel like cramps during your period. Sometimes you feel pain in your back. Most often, contractions feel like muscles pulling painfully in your lower belly. At first, the contractions will probably be 15 to 20 minutes apart. They will not feel too painful. As labor goes on, the contractions get stronger, closer together, and more painful.  How do I time the contractions? Time your contractions by counting the number of minutes from the start of one contraction to the start of the next contraction.  What should I do when the contractions start? If it is night and you can sleep, sleep. If it happens during the day, here are some things you can do to take care of yourself at home: ? Walk. If the pains you are having are real labor, walking will make the contractions come faster and harder. If the contractions are not going to continue and be real labor, walking will make the contractions slow down. ? Take a shower or bath. This will help you relax. ? Eat. Labor is a big event. It takes a lot of energy. ? Drink water. Not drinking enough water can cause  false labor (contractions that hurt but do not open your cervix). If this is true labor, drinking water will help you have strength to get through your labor. ? Take a nap. Get all the rest you can. ? Get a massage. If your labor is in your back, a strong massage on your lower back may feel very good. Getting a foot massage is always good. ? Don't panic. You can do this. Your body was made for this. You are strong!  When should I go to the hospital or call my health care provider? ? Your contractions have been 5 minutes apart or less for at least 1 hour. ? If several contractions are so painful you cannot walk or talk during one. ? Your bag of waters breaks. (You may have a big gush of water or just water that runs down your legs when you walk.)  Are there other reasons to call my health care provider? Yes, you should call your health care provider or go to the hospital if you start to bleed like you are having a period- blood that soaks your underwear or runs down your legs, if you have sudden severe pain, if your baby has not moved for several hours, or if you are leaking green fluid. The rule is as follows: If you are very concerned about something, call.  

## 2019-10-03 ENCOUNTER — Telehealth: Payer: Medicaid Other

## 2019-10-08 ENCOUNTER — Other Ambulatory Visit: Payer: Self-pay

## 2019-10-08 ENCOUNTER — Ambulatory Visit (INDEPENDENT_AMBULATORY_CARE_PROVIDER_SITE_OTHER): Payer: Medicaid Other | Admitting: Family Medicine

## 2019-10-08 VITALS — BP 122/82 | HR 101 | Wt 161.0 lb

## 2019-10-08 DIAGNOSIS — Z3483 Encounter for supervision of other normal pregnancy, third trimester: Secondary | ICD-10-CM

## 2019-10-08 DIAGNOSIS — Z3A39 39 weeks gestation of pregnancy: Secondary | ICD-10-CM

## 2019-10-08 NOTE — Progress Notes (Signed)
   PRENATAL VISIT NOTE  Subjective:  Sheila Griffith is a 24 y.o. G3P0020 at [redacted]w[redacted]d being seen today for ongoing prenatal care.  She is currently monitored for the following issues for this low-risk pregnancy and has Supervision of normal pregnancy; Asthma affecting pregnancy in second trimester; Marginal insertion of umbilical cord affecting management of mother; and Uterine size date discrepancy on their problem list.  Patient reports no complaints.  Contractions: Irregular. Vag. Bleeding: None.  Movement: Present. Denies leaking of fluid.   The following portions of the patient's history were reviewed and updated as appropriate: allergies, current medications, past family history, past medical history, past social history, past surgical history and problem list.   Objective:   Vitals:   10/08/19 1348  BP: 122/82  Pulse: (!) 101  Weight: 161 lb (73 kg)    Fetal Status: Fetal Heart Rate (bpm): 135 Fundal Height: 37 cm Movement: Present  Presentation: Vertex  General:  Alert, oriented and cooperative. Patient is in no acute distress.  Skin: Skin is warm and dry. No rash noted.   Cardiovascular: Normal heart rate noted  Respiratory: Normal respiratory effort, no problems with respiration noted  Abdomen: Soft, gravid, appropriate for gestational age.  Pain/Pressure: Present     Pelvic: Cervical exam performed in the presence of a chaperone Dilation: 1 Effacement (%): 20 Station: -3  Extremities: Normal range of motion.  Edema: Trace  Mental Status: Normal mood and affect. Normal behavior. Normal judgment and thought content.   Assessment and Plan:  Pregnancy: G3P0020 at [redacted]w[redacted]d 1. Encounter for supervision of other normal pregnancy in third trimester - RTC early next week for BPP and visit to schedule IOL if still pregnant - US FETAL BPP WO NON STRESS; Future - GBS neg - Girl, breast, undecided   Term labor symptoms and general obstetric precautions including but not limited to  vaginal bleeding, contractions, leaking of fluid and fetal movement were reviewed in detail with the patient. Please refer to After Visit Summary for other counseling recommendations.   Return in about 5 days (around 10/13/2019) for ROB; in-person with BPP.  No future appointments.  Joselyn Arrow, MD

## 2019-10-10 ENCOUNTER — Inpatient Hospital Stay (HOSPITAL_COMMUNITY)
Admission: AD | Admit: 2019-10-10 | Discharge: 2019-10-13 | DRG: 807 | Disposition: A | Discharge status: Home / Self Care | Payer: Medicaid Other | Attending: Family Medicine | Admitting: Family Medicine

## 2019-10-10 ENCOUNTER — Encounter (HOSPITAL_COMMUNITY): Payer: Self-pay | Admitting: Family Medicine

## 2019-10-10 ENCOUNTER — Other Ambulatory Visit: Payer: Self-pay

## 2019-10-10 DIAGNOSIS — O26893 Other specified pregnancy related conditions, third trimester: Secondary | ICD-10-CM | POA: Diagnosis present

## 2019-10-10 DIAGNOSIS — Z87891 Personal history of nicotine dependence: Secondary | ICD-10-CM | POA: Diagnosis not present

## 2019-10-10 DIAGNOSIS — Z3A40 40 weeks gestation of pregnancy: Secondary | ICD-10-CM

## 2019-10-10 DIAGNOSIS — O26843 Uterine size-date discrepancy, third trimester: Secondary | ICD-10-CM

## 2019-10-10 DIAGNOSIS — Z20822 Contact with and (suspected) exposure to covid-19: Secondary | ICD-10-CM | POA: Diagnosis present

## 2019-10-10 DIAGNOSIS — O43193 Other malformation of placenta, third trimester: Secondary | ICD-10-CM | POA: Diagnosis not present

## 2019-10-10 DIAGNOSIS — O43123 Velamentous insertion of umbilical cord, third trimester: Principal | ICD-10-CM | POA: Diagnosis present

## 2019-10-10 DIAGNOSIS — J45909 Unspecified asthma, uncomplicated: Secondary | ICD-10-CM

## 2019-10-10 DIAGNOSIS — O43199 Other malformation of placenta, unspecified trimester: Secondary | ICD-10-CM

## 2019-10-10 DIAGNOSIS — O9952 Diseases of the respiratory system complicating childbirth: Secondary | ICD-10-CM | POA: Diagnosis not present

## 2019-10-10 DIAGNOSIS — Z3483 Encounter for supervision of other normal pregnancy, third trimester: Secondary | ICD-10-CM

## 2019-10-10 HISTORY — DX: Dermatitis, unspecified: L30.9

## 2019-10-10 LAB — POCT FERN TEST: POCT Fern Test: POSITIVE

## 2019-10-10 LAB — ABO/RH: ABO/RH(D): O POS

## 2019-10-10 LAB — CBC
HCT: 36.2 % (ref 36.0–46.0)
Hemoglobin: 11.7 g/dL — ABNORMAL LOW (ref 12.0–15.0)
MCH: 29.2 pg (ref 26.0–34.0)
MCHC: 32.3 g/dL (ref 30.0–36.0)
MCV: 90.3 fL (ref 80.0–100.0)
Platelets: 214 10*3/uL (ref 150–400)
RBC: 4.01 MIL/uL (ref 3.87–5.11)
RDW: 13.7 % (ref 11.5–15.5)
WBC: 12.8 10*3/uL — ABNORMAL HIGH (ref 4.0–10.5)
nRBC: 0 % (ref 0.0–0.2)

## 2019-10-10 LAB — TYPE AND SCREEN
ABO/RH(D): O POS
Antibody Screen: NEGATIVE

## 2019-10-10 LAB — SARS CORONAVIRUS 2 (TAT 6-24 HRS): SARS Coronavirus 2: NEGATIVE

## 2019-10-10 MED ORDER — ACETAMINOPHEN 325 MG PO TABS: 650.0000 mg | ORAL_TABLET | ORAL | Status: DC | PRN

## 2019-10-10 MED ORDER — LACTATED RINGERS IV SOLN: 500.0000 mL | INTRAVENOUS | Status: DC | PRN

## 2019-10-10 MED ORDER — OXYTOCIN BOLUS FROM INFUSION
500.0000 mL | Freq: Once | INTRAVENOUS | Status: AC
  Administered 2019-10-11: 500 mL via INTRAVENOUS

## 2019-10-10 MED ORDER — OXYCODONE-ACETAMINOPHEN 5-325 MG PO TABS: 1.0000 | ORAL_TABLET | ORAL | Status: DC | PRN

## 2019-10-10 MED ORDER — MISOPROSTOL 50MCG HALF TABLET
50.0000 ug | ORAL_TABLET | ORAL | Status: DC | PRN
  Administered 2019-10-10 (×2): 50 ug via BUCCAL
  Filled 2019-10-10 (×2): qty 1

## 2019-10-10 MED ORDER — LIDOCAINE HCL (PF) 1 % IJ SOLN: 30.0000 mL | INTRAMUSCULAR | Status: DC | PRN

## 2019-10-10 MED ORDER — ONDANSETRON HCL 4 MG/2ML IJ SOLN
4.0000 mg | Freq: Four times a day (QID) | INTRAMUSCULAR | Status: DC | PRN
  Administered 2019-10-11: 4 mg via INTRAVENOUS
  Filled 2019-10-10: qty 2

## 2019-10-10 MED ORDER — SOD CITRATE-CITRIC ACID 500-334 MG/5ML PO SOLN: 30.0000 mL | ORAL | Status: DC | PRN

## 2019-10-10 MED ORDER — FENTANYL CITRATE (PF) 100 MCG/2ML IJ SOLN
100.0000 ug | INTRAMUSCULAR | Status: DC | PRN
  Administered 2019-10-11: 100 ug via INTRAVENOUS
  Filled 2019-10-10: qty 2

## 2019-10-10 MED ORDER — LACTATED RINGERS IV SOLN: INTRAVENOUS | Status: DC

## 2019-10-10 MED ORDER — OXYCODONE-ACETAMINOPHEN 5-325 MG PO TABS: 2.0000 | ORAL_TABLET | ORAL | Status: DC | PRN

## 2019-10-10 MED ORDER — TERBUTALINE SULFATE 1 MG/ML IJ SOLN: 0.2500 mg | Freq: Once | INTRAMUSCULAR | Status: DC | PRN

## 2019-10-10 MED ORDER — OXYTOCIN 40 UNITS IN NORMAL SALINE INFUSION - SIMPLE MED
2.5000 [IU]/h | INTRAVENOUS | Status: DC
  Filled 2019-10-10: qty 1000

## 2019-10-10 NOTE — MAU Note (Signed)
Water broke at 1445, clear fluids.  gushs continue. No bleeding. Some contractions 'here and there', mostly in her back.

## 2019-10-10 NOTE — H&P (Addendum)
OBSTETRIC ADMISSION HISTORY AND PHYSICAL  Sheila Griffith is a 24 y.o. female G3P0020 with IUP at [redacted]w[redacted]d presenting for LOF and intermittent contractions. She reports +FMs. No LOF, headaches, VB, blurry vision, peripheral edema, or RUQ pain. She plans on breast feeding. She is undecided regarding birth control.   Dating: By ultrasound --->  Estimated Date of Delivery: 10/10/19  Sono:  09/03/19  @[redacted]w[redacted]d , normal anatomy, cephalic presentation, EFW 2199g, 35%ile, marginal cord insertion   Prenatal History/Complications: Asthma  Fetus small for dates Marginal cord insertion  Past Medical History: Past Medical History:  Diagnosis Date  . Anxiety   . Asthma    childhood  . Eczema     Past Surgical History: Past Surgical History:  Procedure Laterality Date  . DENTAL SURGERY      Obstetrical History: OB History    Gravida  3   Para      Term      Preterm      AB  2   Living        SAB  2   TAB      Ectopic      Multiple      Live Births              Social History: Social History   Socioeconomic History  . Marital status: Divorced    Spouse name: Not on file  . Number of children: Not on file  . Years of education: Not on file  . Highest education level: High school graduate  Occupational History  . Not on file  Tobacco Use  . Smoking status: Former Smoker    Types: Cigarettes    Quit date: 07/10/2014    Years since quitting: 5.2  . Smokeless tobacco: Never Used  . Tobacco comment: previously social smoker  Substance and Sexual Activity  . Alcohol use: Never  . Drug use: Not Currently    Types: Marijuana    Comment: none since 2017  . Sexual activity: Yes    Birth control/protection: None  Other Topics Concern  . Not on file  Social History Narrative  . Not on file   Social Determinants of Health   Financial Resource Strain: Medium Risk  . Difficulty of Paying Living Expenses: Somewhat hard  Food Insecurity: No Food Insecurity  . Worried  About 2018 in the Last Year: Never true  . Ran Out of Food in the Last Year: Never true  Transportation Needs: No Transportation Needs  . Lack of Transportation (Medical): No  . Lack of Transportation (Non-Medical): No  Physical Activity: Inactive  . Days of Exercise per Week: 0 days  . Minutes of Exercise per Session: 0 min  Stress: Stress Concern Present  . Feeling of Stress : To some extent  Social Connections: Slightly Isolated  . Frequency of Communication with Friends and Family: More than three times a week  . Frequency of Social Gatherings with Friends and Family: Three times a week  . Attends Religious Services: More than 4 times per year  . Active Member of Clubs or Organizations: No  . Attends Programme researcher, broadcasting/film/video Meetings: Never  . Marital Status: Living with partner    Family History: Family History  Problem Relation Age of Onset  . Depression Mother   . Anxiety disorder Mother   . Arthritis Mother   . Diabetes Father   . Stroke Father   . Heart disease Father   . Depression Father   .  Anxiety disorder Father     Allergies: Allergies  Allergen Reactions  . Peanut-Containing Drug Products Itching    Medications Prior to Admission  Medication Sig Dispense Refill Last Dose  . Prenatal Vit-Fe Fumarate-FA (PRENATAL VITAMIN PO) Take by mouth.   Past Week at Unknown time     Review of Systems:  All systems reviewed and negative except as stated in HPI  PE: Blood pressure 129/76, pulse (!) 110, temperature 98.2 F (36.8 C), temperature source Oral, resp. rate 18, height 5\' 7"  (1.702 m), weight 72.8 kg, SpO2 99 %.   General appearance: alert and no distress Lungs: regular rate and effort, clear to ascultation bilaterally  Heart: tachycardic, regular rhythm  Abdomen: soft, gravid, non-tender Extremities: Homans sign is negative, no sign of DVT Presentation: cephalic EFM: 967E bpm, moderate variability, pos accels, early decels Toco: every  7-8 minutes    SVE: 3 / 50 / -3  Prenatal labs: ABO, Rh: --/--/PENDING (04/02 1640) Antibody: PENDING (04/02 1640) Rubella: 5.40 (09/16 1218) RPR: Non Reactive (02/03 0856)  HBsAg: Negative (09/16 1218)  HIV: Non Reactive (02/03 0856)  GBS: Negative/-- (03/19 1430)  2 hr GTT  Normal   Prenatal Transfer Tool  Maternal Diabetes: No Genetic Screening: Normal Maternal Ultrasounds/Referrals: Other: marginal cord insertion Fetal Ultrasounds or other Referrals:  None Maternal Substance Abuse:  No Significant Maternal Medications:  None Significant Maternal Lab Results: Group B Strep negative  Results for orders placed or performed during the hospital encounter of 10/10/19 (from the past 24 hour(s))  Fern Test   Collection Time: 10/10/19  4:28 PM  Result Value Ref Range   POCT Fern Test Positive = ruptured amniotic membanes   Type and screen [place]   Collection Time: 10/10/19  4:40 PM  Result Value Ref Range   ABO/RH(D) PENDING    Antibody Screen PENDING    Sample Expiration      10/13/2019,2359 Performed at [facility], [place] [address], [place], [place] [address]     Patient Active Problem List   Diagnosis Date Noted  . Labor and delivery, indication for care 10/10/2019  . [redacted] weeks gestation of pregnancy 10/10/2019  . Asthma affecting pregnancy in second trimester 08/13/2019  . Marginal insertion of umbilical cord affecting management of mother 08/13/2019  . Uterine size date discrepancy 08/13/2019  . Supervision of normal pregnancy 03/26/2019    Assessment: Sheila Griffith is a 24 y.o. G3P0020 at [redacted]w[redacted]d here for contractions and intermittent contractions.    1. Labor: Patient agreeable to Cytotec. Risks and benefits of labor augmentation discussed with patient. 2. FWB: Cat 1  3. Pain: per patient request 4. GBS: Negative  5. Headache    Plan: Admit to L&D.     Lyndee Hensen, DO  10/10/2019, 5:06 PM  I saw and evaluated the patient.  I agree with the findings and the plan of care as documented in the resident's note. EFW 3200g. Anticipate SVD.   Barrington Ellison, MD [facility] Family Medicine Fellow, [facility] for [employer], [place]

## 2019-10-10 NOTE — Progress Notes (Signed)
Labor Progress Note Sheila Griffith is a 24 y.o. G3P0020 at [redacted]w[redacted]d presented for SOL.  S: pt starting to feel stronger contractions.  Able to breathe through them.  Discussed pain medication with pt. Wants to do IV pain meds first. Will let us know when she is ready for first dose.    O:  BP 131/77   Pulse 81   Temp 98 F (36.7 C) (Oral)   Resp 16   Ht 5\' 7"  (1.702 m)   Wt 72.8 kg   LMP  (LMP Unknown)   SpO2 99%   BMI 25.15 kg/m  EFM: 140/accels, mod variability, occasional variable decels/q2-73m  CVE: Dilation: 3 Effacement (%): 80 Cervical Position: Middle Station: -1 Presentation: Vertex Exam by:: [email or web address] [place] RN   A&P: 24 y.o. 25 [redacted]w[redacted]d presents with SOL.   #Labor: Progressing well. Received second dose of cytotec at 2204.  3/80/-1 at 2200.  #Pain: per pt request.  Wants IV fentanyl first.   #FWB: cat II #GBS negative  2205, MD 10:39 PM

## 2019-10-11 ENCOUNTER — Encounter (HOSPITAL_COMMUNITY): Payer: Self-pay | Admitting: Obstetrics & Gynecology

## 2019-10-11 ENCOUNTER — Inpatient Hospital Stay (HOSPITAL_COMMUNITY): Payer: Medicaid Other | Admitting: Anesthesiology

## 2019-10-11 DIAGNOSIS — J45909 Unspecified asthma, uncomplicated: Secondary | ICD-10-CM

## 2019-10-11 DIAGNOSIS — O9952 Diseases of the respiratory system complicating childbirth: Secondary | ICD-10-CM

## 2019-10-11 DIAGNOSIS — Z3A40 40 weeks gestation of pregnancy: Secondary | ICD-10-CM

## 2019-10-11 DIAGNOSIS — O43193 Other malformation of placenta, third trimester: Secondary | ICD-10-CM

## 2019-10-11 LAB — RPR: RPR Ser Ql: NONREACTIVE

## 2019-10-11 MED ORDER — PHENYLEPHRINE 40 MCG/ML (10ML) SYRINGE FOR IV PUSH (FOR BLOOD PRESSURE SUPPORT): 80.0000 ug | PREFILLED_SYRINGE | INTRAVENOUS | Status: DC | PRN

## 2019-10-11 MED ORDER — DIBUCAINE (PERIANAL) 1 % EX OINT: 1.0000 "application " | TOPICAL_OINTMENT | CUTANEOUS | Status: DC | PRN

## 2019-10-11 MED ORDER — EPHEDRINE 5 MG/ML INJ: 10.0000 mg | INTRAVENOUS | Status: DC | PRN

## 2019-10-11 MED ORDER — COCONUT OIL OIL: 1.0000 | TOPICAL_OIL | Status: DC | PRN

## 2019-10-11 MED ORDER — WITCH HAZEL-GLYCERIN EX PADS
1.0000 "application " | MEDICATED_PAD | CUTANEOUS | Status: DC | PRN
  Administered 2019-10-11: 1 via TOPICAL

## 2019-10-11 MED ORDER — TERBUTALINE SULFATE 1 MG/ML IJ SOLN: 0.2500 mg | Freq: Once | INTRAMUSCULAR | Status: DC | PRN

## 2019-10-11 MED ORDER — FENTANYL-BUPIVACAINE-NACL 0.5-0.125-0.9 MG/250ML-% EP SOLN
12.0000 mL/h | EPIDURAL | Status: DC | PRN
  Filled 2019-10-11: qty 250

## 2019-10-11 MED ORDER — ONDANSETRON HCL 4 MG/2ML IJ SOLN: 4.0000 mg | INTRAMUSCULAR | Status: DC | PRN

## 2019-10-11 MED ORDER — SIMETHICONE 80 MG PO CHEW: 80.0000 mg | CHEWABLE_TABLET | ORAL | Status: DC | PRN

## 2019-10-11 MED ORDER — DIPHENHYDRAMINE HCL 25 MG PO CAPS
25.0000 mg | ORAL_CAPSULE | Freq: Four times a day (QID) | ORAL | Status: DC | PRN
  Administered 2019-10-12 – 2019-10-13 (×2): 25 mg via ORAL
  Filled 2019-10-11 (×2): qty 1

## 2019-10-11 MED ORDER — LACTATED RINGERS IV SOLN: 500.0000 mL | Freq: Once | INTRAVENOUS | Status: DC

## 2019-10-11 MED ORDER — SENNOSIDES-DOCUSATE SODIUM 8.6-50 MG PO TABS
2.0000 | ORAL_TABLET | ORAL | Status: DC
  Administered 2019-10-11 – 2019-10-13 (×2): 2 via ORAL
  Filled 2019-10-11 (×2): qty 2

## 2019-10-11 MED ORDER — SODIUM CHLORIDE (PF) 0.9 % IJ SOLN
INTRAMUSCULAR | Status: DC | PRN
  Administered 2019-10-11: 12 mL/h via EPIDURAL

## 2019-10-11 MED ORDER — OXYTOCIN 40 UNITS IN NORMAL SALINE INFUSION - SIMPLE MED
1.0000 m[IU]/min | INTRAVENOUS | Status: DC
  Administered 2019-10-11: 2 m[IU]/min via INTRAVENOUS

## 2019-10-11 MED ORDER — TETANUS-DIPHTH-ACELL PERTUSSIS 5-2.5-18.5 LF-MCG/0.5 IM SUSP: 0.5000 mL | Freq: Once | INTRAMUSCULAR | Status: DC

## 2019-10-11 MED ORDER — IBUPROFEN 600 MG PO TABS
600.0000 mg | ORAL_TABLET | Freq: Four times a day (QID) | ORAL | Status: DC
  Administered 2019-10-11 – 2019-10-13 (×8): 600 mg via ORAL
  Filled 2019-10-11 (×8): qty 1

## 2019-10-11 MED ORDER — ONDANSETRON HCL 4 MG PO TABS: 4.0000 mg | ORAL_TABLET | ORAL | Status: DC | PRN

## 2019-10-11 MED ORDER — DIPHENHYDRAMINE HCL 50 MG/ML IJ SOLN: 12.5000 mg | INTRAMUSCULAR | Status: DC | PRN

## 2019-10-11 MED ORDER — PRENATAL MULTIVITAMIN CH
1.0000 | ORAL_TABLET | Freq: Every day | ORAL | Status: DC
  Administered 2019-10-11 – 2019-10-13 (×3): 1 via ORAL
  Filled 2019-10-11 (×3): qty 1

## 2019-10-11 MED ORDER — BENZOCAINE-MENTHOL 20-0.5 % EX AERO
1.0000 "application " | INHALATION_SPRAY | CUTANEOUS | Status: DC | PRN
  Administered 2019-10-11: 1 via TOPICAL
  Filled 2019-10-11: qty 56

## 2019-10-11 MED ORDER — ACETAMINOPHEN 325 MG PO TABS: 650.0000 mg | ORAL_TABLET | ORAL | Status: DC | PRN

## 2019-10-11 MED ORDER — LIDOCAINE-EPINEPHRINE (PF) 2 %-1:200000 IJ SOLN
INTRAMUSCULAR | Status: DC | PRN
  Administered 2019-10-11: 2 mL via EPIDURAL
  Administered 2019-10-11: 3 mL via EPIDURAL

## 2019-10-11 MED ORDER — ZOLPIDEM TARTRATE 5 MG PO TABS: 5.0000 mg | ORAL_TABLET | Freq: Every evening | ORAL | Status: DC | PRN

## 2019-10-11 NOTE — Anesthesia Preprocedure Evaluation (Signed)
Anesthesia Evaluation  Patient identified by MRN, date of birth, ID band Patient awake    Reviewed: Allergy & Precautions, NPO status , Patient's Chart, lab work & pertinent test results  Airway Mallampati: II  TM Distance: >3 FB Neck ROM: Full    Dental no notable dental hx.    Pulmonary asthma , former smoker,    Pulmonary exam normal breath sounds clear to auscultation       Cardiovascular negative cardio ROS Normal cardiovascular exam Rhythm:Regular Rate:Normal     Neuro/Psych PSYCHIATRIC DISORDERS Anxiety negative neurological ROS     GI/Hepatic negative GI ROS, Neg liver ROS,   Endo/Other  negative endocrine ROS  Renal/GU negative Renal ROS  negative genitourinary   Musculoskeletal negative musculoskeletal ROS (+)   Abdominal   Peds  Hematology negative hematology ROS (+)   Anesthesia Other Findings   Reproductive/Obstetrics (+) Pregnancy                             Anesthesia Physical Anesthesia Plan  ASA: II  Anesthesia Plan: Epidural   Post-op Pain Management:    Induction:   PONV Risk Score and Plan: Treatment may vary due to age or medical condition  Airway Management Planned: Natural Airway  Additional Equipment:   Intra-op Plan:   Post-operative Plan:   Informed Consent: I have reviewed the patients History and Physical, chart, labs and discussed the procedure including the risks, benefits and alternatives for the proposed anesthesia with the patient or authorized representative who has indicated his/her understanding and acceptance.       Plan Discussed with: Anesthesiologist  Anesthesia Plan Comments: (Patient identified. Risks, benefits, options discussed with patient including but not limited to bleeding, infection, nerve damage, paralysis, failed block, incomplete pain control, headache, blood pressure changes, nausea, vomiting, reactions to  medication, itching, and post partum back pain. Confirmed with bedside nurse the patient's most recent platelet count. Confirmed with the patient that they are not taking any anticoagulation, have any bleeding history or any family history of bleeding disorders. Patient expressed understanding and wishes to proceed. All questions were answered. )        Anesthesia Quick Evaluation

## 2019-10-11 NOTE — Progress Notes (Signed)
Labor Progress Note Sheila Griffith is a 24 y.o. G3P0020 at [redacted]w[redacted]d presented for SOL.  S:  Pt states contractions are more painful and coming more frequently.  Pt had just received fentanyl, which she states was helping with her pain.  O:  BP 118/69   Pulse 87   Temp 98.9 F (37.2 C) (Axillary)   Resp 17   Ht 5\' 7"  (1.702 m)   Wt 72.8 kg   LMP  (LMP Unknown)   SpO2 100%   BMI 25.15 kg/m  EFM: 140/mod var. Present accels/q65m  CVE: Dilation: 3 Effacement (%): 80 Cervical Position: Middle Station: -1 Presentation: Vertex Exam by:: [email or web address] [place] RN   A&P: 24 y.o. G3P0020 [redacted]w[redacted]d p/w SOL.   #Labor: Progressing well. Has received 2 doses of cytotec.  Declines pitocin #Pain: fentanyl recently given.  Epidural order placed.  #FWB: cat I #GBS negative  [redacted]w[redacted]d, MD 5:04 AM

## 2019-10-11 NOTE — Anesthesia Postprocedure Evaluation (Signed)
Anesthesia Post Note  Patient: Sheila Griffith  Procedure(s) Performed: AN AD HOC LABOR EPIDURAL     Patient location during evaluation: Mother Baby Anesthesia Type: Epidural Level of consciousness: awake and alert, oriented and patient cooperative Pain management: pain level controlled Vital Signs Assessment: post-procedure vital signs reviewed and stable Respiratory status: spontaneous breathing Cardiovascular status: stable Postop Assessment: no headache, epidural receding, patient able to bend at knees and no signs of nausea or vomiting Anesthetic complications: no Comments: Pt. States she is walking.  Pain score 2.     Last Vitals:  Vitals:   10/11/19 1345 10/11/19 1732  BP: 122/82 125/80  Pulse: (!) 118 96  Resp: 18 18  Temp: 37.8 C 36.8 C  SpO2: 100% 100%    Last Pain:  Vitals:   10/11/19 1732  TempSrc: Oral  PainSc: 3    Pain Goal:                   [facility]

## 2019-10-11 NOTE — Anesthesia Procedure Notes (Signed)
Epidural Patient location during procedure: OB Start time: 10/11/2019 4:10 AM End time: 10/11/2019 4:25 AM  Staffing Anesthesiologist: Elmer Picker, MD Performed: anesthesiologist   Preanesthetic Checklist Completed: patient identified, IV checked, risks and benefits discussed, monitors and equipment checked, pre-op evaluation and timeout performed  Epidural Patient position: sitting Prep: DuraPrep and site prepped and draped Patient monitoring: continuous pulse ox, blood pressure, heart rate and cardiac monitor Approach: midline Location: L3-L4 Injection technique: LOR air  Needle:  Needle type: Tuohy  Needle gauge: 17 G Needle length: 9 cm Needle insertion depth: 5 cm Catheter type: closed end flexible Catheter size: 19 Gauge Catheter at skin depth: 11 cm Test dose: negative  Assessment Sensory level: T8 Events: blood not aspirated, injection not painful, no injection resistance, no paresthesia and negative IV test  Additional Notes Patient identified. Risks/Benefits/Options discussed with patient including but not limited to bleeding, infection, nerve damage, paralysis, failed block, incomplete pain control, headache, blood pressure changes, nausea, vomiting, reactions to medication both or allergic, itching and postpartum back pain. Confirmed with bedside nurse the patient's most recent platelet count. Confirmed with patient that they are not currently taking any anticoagulation, have any bleeding history or any family history of bleeding disorders. Patient expressed understanding and wished to proceed. All questions were answered. Sterile technique was used throughout the entire procedure. Please see nursing notes for vital signs. Test dose was given through epidural catheter and negative prior to continuing to dose epidural or start infusion. Warning signs of high block given to the patient including shortness of breath, tingling/numbness in hands, complete motor block, or  any concerning symptoms with instructions to call for help. Patient was given instructions on fall risk and not to get out of bed. All questions and concerns addressed with instructions to call with any issues or inadequate analgesia.  Reason for block:procedure for pain

## 2019-10-11 NOTE — Lactation Note (Signed)
This note was copied from a baby's chart. Lactation Consultation Note  Patient Name: Sheila Griffith GLOVF'I Date: 10/11/2019 Reason for consult: Initial assessment;Primapara;1st time breastfeeding;Term  2012 - 2035 - I conducted an initial lactation consult with Sheila Griffith. She states that her daughter "Sheila Griffith" has latched 3 times since delivery, including in recovery. She is now 72 hours old.  Baby was due for the next feeding, and I assisted with a latch attempt on the left breast in football hold. We practiced hand expression. Mom able to repeat back well. Colostrum noted. Sheila Griffith has pliable, slightly short nipples that are WNL.  I demonstrated how to hold breast and positioning baby. Baby was too sleepy to latch even with gentle "pestering."  We hand expressed drops of colostrum into the baby's mouth.   I educated on breast feeding basics at the bedside. I discouraged the use of artificial nipples until breast feeding was well established and educated on output expectations for days 1-3 as well as normal infant feeding patterns on days 1 and 2.  Sheila Griffith has a manual pump that she brought with her. She does not have an electric pump, but she is interested in one. She has [facility]. I agreed to fax in a referral.  All questions answered at this time. I recommended breast feeding on demand 8-12 times a day, waking baby to feed as needed.   Maternal Data Has patient been taught Hand Expression?: Yes Does the patient have breastfeeding experience prior to this delivery?: No  Feeding Feeding Type: Breast Fed  LATCH Score Latch: Too sleepy or reluctant, no latch achieved, no sucking elicited.  Audible Swallowing: None  Type of Nipple: Everted at rest and after stimulation  Comfort (Breast/Nipple): Soft / non-tender  Hold (Positioning): Assistance needed to correctly position infant at breast and maintain latch.  LATCH Score:  5  Interventions Interventions: Breast feeding basics reviewed;Assisted with latch;Hand express;Breast compression;Adjust position  Lactation Tools Discussed/Used     Consult Status Consult Status: Follow-up Date: 10/12/19 Follow-up type: In-patient    Walker Shadow 10/11/2019, 8:38 PM

## 2019-10-11 NOTE — Discharge Summary (Addendum)
Postpartum Discharge Summary   Patient Name: Sheila Griffith DOB: [birth year] MRN: [id number]  Date of admission: 10/10/2019 Delivering Provider: Lyndee Griffith   Date of discharge: 10/13/2019  Admitting diagnosis: Labor and delivery indication for care or intervention [O75.9] Intrauterine pregnancy: [redacted]w[redacted]d    Secondary diagnosis:  Active Problems:   Labor and delivery, indication for care   [redacted] weeks gestation of pregnancy  Additional problems: none     Discharge diagnosis: Term Pregnancy Delivered                                                                                                Post partum procedures:none  Augmentation: Pitocin 2nd stage   Complications: None  Hospital course:  Onset of Labor With Vaginal Delivery     24y.o. yo G3P0020 at 441w1das admitted in Latent Labor on 10/10/2019. Patient had an uncomplicated labor course as follows:  Membrane Rupture Time/Date: 2:45 PM ,10/10/2019   Intrapartum Procedures: Episiotomy: None [1]                                         Lacerations:  Periurethral [8]  Patient had a delivery of a Viable infant. 10/11/2019  Information for the patient's newborn:  Sheila, Griffith[[id number]]Delivery Method: Vag-Spont     [name] had an uncomplicated postpartum course.  She is ambulating, tolerating a regular diet, passing flatus, and urinating well. Patient is discharged home in stable condition on 10/13/19.  Delivery time: 10:53 AM    Magnesium Sulfate received: No BMZ received: No Rhophylac:N/A MMR:N/A Transfusion:No  Physical exam  Vitals:   10/12/19 0600 10/12/19 1339 10/12/19 2110 10/13/19 0535  BP: (!) 128/93 122/82 110/75 127/81  Pulse: 100 100 85 87  Resp: '18 19 16 18  '$ Temp: 97.7 F (36.5 C) 99.1 F (37.3 C) 98 F (36.7 C) 98.2 F (36.8 C)  TempSrc: Oral Oral Oral Oral  SpO2:  100% 99% 99%  Weight:      Height:       General: alert Lochia: appropriate Uterine Fundus: firm Incision: N/A DVT  Evaluation: No evidence of DVT seen on physical exam. Labs: Lab Results  Component Value Date   WBC 12.8 (H) 10/10/2019   HGB 11.7 (L) 10/10/2019   HCT 36.2 10/10/2019   MCV 90.3 10/10/2019   PLT 214 10/10/2019   CMP Latest Ref Rng & Units 02/21/2018  Glucose 70 - 99 mg/dL 83  BUN 6 - 20 mg/dL 7  Creatinine 0.44 - 1.00 mg/dL 0.69  Sodium 135 - 145 mmol/L 140  Potassium 3.5 - 5.1 mmol/L 4.1  Chloride 98 - 111 mmol/L 105  CO2 22 - 32 mmol/L 27  Calcium 8.9 - 10.3 mg/dL 9.7  Total Protein 6.5 - 8.1 g/dL 7.5  Total Bilirubin 0.3 - 1.2 mg/dL 0.8  Alkaline Phos 38 - 126 U/L 63  AST 15 - 41 U/L 18  ALT 0 - 44 U/L 13   Edinburgh Score: Edinburgh Postnatal Depression Scale  Screening Tool 10/11/2019  I have been able to laugh and see the funny side of things. 0  I have looked forward with enjoyment to things. 0  I have blamed myself unnecessarily when things went wrong. 1  I have been anxious or worried for no good reason. 2  I have felt scared or panicky for no good reason. 2  Things have been getting on top of me. 0  I have been so unhappy that I have had difficulty sleeping. 0  I have felt sad or miserable. 1  I have been so unhappy that I have been crying. 0  The thought of harming myself has occurred to me. 0  Edinburgh Postnatal Depression Scale Total 6    Discharge instruction: per After Visit Summary and "Baby and Me Booklet".  After visit meds:  Allergies as of 10/13/2019      Reactions   Peanut-containing Drug Products Itching      Medication List    TAKE these medications   acetaminophen 325 MG tablet Commonly known as: Tylenol Take 2 tablets (650 mg total) by mouth every 4 (four) hours as needed (for pain scale < 4).   ibuprofen 600 MG tablet Commonly known as: ADVIL Take 1 tablet (600 mg total) by mouth every 6 (six) hours.   PRENATAL VITAMIN PO Take by mouth.       Diet: routine diet  Activity: Advance as tolerated. Pelvic rest for 6 weeks.    Outpatient follow up:4 weeks Follow up Appt: Future Appointments  Date Time Provider [place]  11/24/2019 11:30 AM Sheila Griffith, CNM [facility]   Follow up Visit: Sheila Griffith, CNM  Sheila Griffith  Please schedule this patient for PP visit in: 6 weeks  Low risk pregnancy complicated by: nothing  Delivery mode: SVD  Anticipated Birth Control: other/unsure  PP Procedures needed: pap  Schedule Integrated BH visit: no  Provider: Any provider    Newborn Data: Live born female  Birth Weight:   APGAR: 39, 9  Newborn Delivery   Birth date/time: 10/11/2019 10:53:00 Delivery type: Vaginal, Spontaneous      Baby Feeding: Breast Disposition: mom rooming in with baby while waiting for labs -- then home with mother   10/13/2019 Sheila Pike, MD  Provider attestation I have seen and examined this patient and agree with above documentation in the resident's note.   Sheila Griffith is a 24 y.o. [id number] s/p SVD.  Pain is well controlled. Plan for birth control is undecided. Method of Feeding: breast  PE:  Gen: well appearing Heart: reg rate Lungs: normal WOB Fundus firm Ext: no pain, no edema  Recent Labs    10/10/19 1630  HGB 11.7*  HCT 36.2    Assessment S/p SVD PPD # 2  Plan: - discharge to room in with baby - postpartum care discussed - f/u in office in 6 weeks for postpartum visit -undecided contraception --- will discuss with provider at [facility] visit   Sheila Guild, NP 11:00 AM

## 2019-10-12 NOTE — Progress Notes (Signed)
CSW received consult for hx of Anxiety.   CSW met with MOB to offer support and complete assessment. FOB and infant Grace Mae were present on arrival, however, after PPD and SIDS education, FOB stepped out of room to offer MOB privacy during assessment. MOB and FOB were very engaged and pleasant during visit.   MOB reported history of on and off episodes of panic attacks since high school. MOB reported having the following sx during panic attacks; nausea, rapid heart rate, and dizziness. MOB denied any history  medication or therapy treatment. MOB denied any other mental health dx or concerns.  MOB stated she is able to manage sx by processes feelings, talking with loved ones, and redirecting focus. MOB stated if she gets to a point she is no longer able to manage sx she plans to talk with doctor about medication, or seek counseling services. MOB denied any SI, HI, or domestic violence. MOB identified FOB, mom, and dad as support.   CSW provided education regarding the baby blues period vs. perinatal mood disorders, discussed treatment and gave resources for mental health follow up if concerns arise.  CSW recommends self-evaluation during the postpartum time period using the New Mom Checklist from Postpartum Progress and encouraged MOB and FOB to contact a medical professional if symptoms are noted at any time. MOB and FOB asked appropriate questions and denied any additional questions or concerns.    CSW provided review of Sudden Infant Death Syndrome (SIDS) precautions. MOB confirmed having all needed items for baby including new car seat, and crib and bassinet for baby's safe sleeping.    CSW identifies no further need for intervention and no barriers to discharge at this time.  Adie Vilar D. Camilah Spillman, MSW, LCSWA Clinical Social Worker [phone] 

## 2019-10-12 NOTE — Progress Notes (Signed)
Post Partum Day 1 Subjective: no complaints, up ad lib, voiding, tolerating PO and + flatus  Objective: Blood pressure (!) 128/93, pulse 100, temperature 97.7 F (36.5 C), temperature source Oral, resp. rate 18, height 5\' 7"  (1.702 m), weight 72.8 kg, SpO2 99 %, unknown if currently breastfeeding.  Physical Exam:  General: alert and no distress Lochia: appropriate Uterine Fundus: firm Incision: NA DVT Evaluation: No evidence of DVT seen on physical exam. No cords or calf tenderness.  Recent Labs    10/10/19 1630  HGB 11.7*  HCT 36.2    Assessment/Plan: Patient can be discharged if baby is discharged.  Undecided on future contraception methods.    LOS: 2 days   12/10/19 , DO  10/12/2019, 7:47 AM

## 2019-10-13 ENCOUNTER — Other Ambulatory Visit: Payer: Medicaid Other

## 2019-10-13 ENCOUNTER — Encounter: Payer: Medicaid Other | Admitting: Obstetrics and Gynecology

## 2019-10-13 MED ORDER — IBUPROFEN 600 MG PO TABS: 600.0000 mg | ORAL_TABLET | Freq: Four times a day (QID) | ORAL | 0 refills | Status: DC

## 2019-10-13 MED ORDER — ACETAMINOPHEN 325 MG PO TABS: 650.0000 mg | ORAL_TABLET | ORAL | 0 refills | Status: DC | PRN

## 2019-10-13 NOTE — Lactation Note (Addendum)
This note was copied from a baby's chart. Lactation Consultation Note  Patient Name: Sheila Griffith TAVWP'[id number] Date: 10/13/2019    Infant is 58 hrs old. Formula feedings only have been charted recently with no documented feedings at the breast in greater than 24 hrs. I asked Bernette Mayers, RN to inquire with patient if she has any questions for lactation today.  I also noted that parents had increased feeding volume, but now infant is only receiving 10-12 mL with bottles. I shared that with RN, also.    Lurline Hare [facility] 10/13/2019, 10:12 AM  Per RN, Mom has no need or desire to see lactation today. Glenetta Hew

## 2019-10-13 NOTE — Discharge Instructions (Signed)
Postpartum Care After Vaginal Delivery This sheet gives you information about how to care for yourself from the time you deliver your baby to up to 6-12 weeks after delivery (postpartum period). Your health care provider may also give you more specific instructions. If you have problems or questions, contact your health care provider. Follow these instructions at home: Vaginal bleeding  It is normal to have vaginal bleeding (lochia) after delivery. Wear a sanitary pad for vaginal bleeding and discharge. ? During the first week after delivery, the amount and appearance of lochia is often similar to a menstrual period. ? Over the next few weeks, it will gradually decrease to a dry, yellow-brown discharge. ? For most women, lochia stops completely by 4-6 weeks after delivery. Vaginal bleeding can vary from woman to woman.  Change your sanitary pads frequently. Watch for any changes in your flow, such as: ? A sudden increase in volume. ? A change in color. ? Large blood clots.  If you pass a blood clot from your vagina, save it and call your health care provider to discuss. Do not flush blood clots down the toilet before talking with your health care provider.  Do not use tampons or douches until your health care provider says this is safe.  If you are not breastfeeding, your period should return 6-8 weeks after delivery. If you are feeding your child breast milk only (exclusive breastfeeding), your period may not return until you stop breastfeeding. Perineal care  Keep the area between the vagina and the anus (perineum) clean and dry as told by your health care provider. Use medicated pads and pain-relieving sprays and creams as directed.  If you had a cut in the perineum (episiotomy) or a tear in the vagina, check the area for signs of infection until you are healed. Check for: ? More redness, swelling, or pain. ? Fluid or blood coming from the cut or tear. ? Warmth. ? Pus or a bad  smell.  You may be given a squirt bottle to use instead of wiping to clean the perineum area after you go to the bathroom. As you start healing, you may use the squirt bottle before wiping yourself. Make sure to wipe gently.  To relieve pain caused by an episiotomy, a tear in the vagina, or swollen veins in the anus (hemorrhoids), try taking a warm sitz bath 2-3 times a day. A sitz bath is a warm water bath that is taken while you are sitting down. The water should only come up to your hips and should cover your buttocks. Breast care  Within the first few days after delivery, your breasts may feel heavy, full, and uncomfortable (breast engorgement). Milk may also leak from your breasts. Your health care provider can suggest ways to help relieve the discomfort. Breast engorgement should go away within a few days.  If you are breastfeeding: ? Wear a bra that supports your breasts and fits you well. ? Keep your nipples clean and dry. Apply creams and ointments as told by your health care provider. ? You may need to use breast pads to absorb milk that leaks from your breasts. ? You may have uterine contractions every time you breastfeed for up to several weeks after delivery. Uterine contractions help your uterus return to its normal size. ? If you have any problems with breastfeeding, work with your health care provider or lactation consultant.  If you are not breastfeeding: ? Avoid touching your breasts a lot. Doing this can make   your breasts produce more milk. ? Wear a good-fitting bra and use cold packs to help with swelling. ? Do not squeeze out (express) milk. This causes you to make more milk. Intimacy and sexuality  Ask your health care provider when you can engage in sexual activity. This may depend on: ? Your risk of infection. ? How fast you are healing. ? Your comfort and desire to engage in sexual activity.  You are able to get pregnant after delivery, even if you have not had  your period. If desired, talk with your health care provider about methods of birth control (contraception). Medicines  Take over-the-counter and prescription medicines only as told by your health care provider.  If you were prescribed an antibiotic medicine, take it as told by your health care provider. Do not stop taking the antibiotic even if you start to feel better. Activity  Gradually return to your normal activities as told by your health care provider. Ask your health care provider what activities are safe for you.  Rest as much as possible. Try to rest or take a nap while your baby is sleeping. Eating and drinking   Drink enough fluid to keep your urine pale yellow.  Eat high-fiber foods every day. These may help prevent or relieve constipation. High-fiber foods include: ? Whole grain cereals and breads. ? Brown rice. ? Beans. ? Fresh fruits and vegetables.  Do not try to lose weight quickly by cutting back on calories.  Take your prenatal vitamins until your postpartum checkup or until your health care provider tells you it is okay to stop. Lifestyle  Do not use any products that contain nicotine or tobacco, such as cigarettes and e-cigarettes. If you need help quitting, ask your health care provider.  Do not drink alcohol, especially if you are breastfeeding. General instructions  Keep all follow-up visits for you and your baby as told by your health care provider. Most women visit their health care provider for a postpartum checkup within the first 3-6 weeks after delivery. Contact a health care provider if:  You feel unable to cope with the changes that your child brings to your life, and these feelings do not go away.  You feel unusually sad or worried.  Your breasts become red, painful, or hard.  You have a fever.  You have trouble holding urine or keeping urine from leaking.  You have little or no interest in activities you used to enjoy.  You have not  breastfed at all and you have not had a menstrual period for 12 weeks after delivery.  You have stopped breastfeeding and you have not had a menstrual period for 12 weeks after you stopped breastfeeding.  You have questions about caring for yourself or your baby.  You pass a blood clot from your vagina. Get help right away if:  You have chest pain.  You have difficulty breathing.  You have sudden, severe leg pain.  You have severe pain or cramping in your lower abdomen.  You bleed from your vagina so much that you fill more than one sanitary pad in one hour. Bleeding should not be heavier than your heaviest period.  You develop a severe headache.  You faint.  You have blurred vision or spots in your vision.  You have bad-smelling vaginal discharge.  You have thoughts about hurting yourself or your baby. If you ever feel like you may hurt yourself or others, or have thoughts about taking your own life, get help  right away. You can go to the nearest emergency department or call:  Your local emergency services (911 in the [place]  A suicide crisis helpline, such as the [facility] at 802-589-6855. This is open 24 hours a day. Summary  The period of time right after you deliver your newborn up to 6-12 weeks after delivery is called the postpartum period.  Gradually return to your normal activities as told by your health care provider.  Keep all follow-up visits for you and your baby as told by your health care provider. This information is not intended to replace advice given to you by your health care provider. Make sure you discuss any questions you have with your health care provider. Document Revised: 06/29/2017 Document Reviewed: 04/09/2017 Elsevier Patient Education  2020 [employer].     Contraception Choices Contraception, also called birth control, refers to methods or devices that prevent pregnancy. Hormonal methods Contraceptive  implant  A contraceptive implant is a thin, plastic tube that contains a hormone. It is inserted into the upper part of the arm. It can remain in place for up to 3 years. Progestin-only injections Progestin-only injections are injections of progestin, a synthetic form of the hormone progesterone. They are given every 3 months by a health care provider. Birth control pills  Birth control pills are pills that contain hormones that prevent pregnancy. They must be taken once a day, preferably at the same time each day. Birth control patch  The birth control patch contains hormones that prevent pregnancy. It is placed on the skin and must be changed once a week for three weeks and removed on the fourth week. A prescription is needed to use this method of contraception. Vaginal ring  A vaginal ring contains hormones that prevent pregnancy. It is placed in the vagina for three weeks and removed on the fourth week. After that, the process is repeated with a new ring. A prescription is needed to use this method of contraception. Emergency contraceptive Emergency contraceptives prevent pregnancy after unprotected sex. They come in pill form and can be taken up to 5 days after sex. They work best the sooner they are taken after having sex. Most emergency contraceptives are available without a prescription. This method should not be used as your only form of birth control. Barrier methods Female condom  A female condom is a thin sheath that is worn over the penis during sex. Condoms keep sperm from going inside a woman's body. They can be used with a spermicide to increase their effectiveness. They should be disposed after a single use. Female condom  A female condom is a soft, loose-fitting sheath that is put into the vagina before sex. The condom keeps sperm from going inside a woman's body. They should be disposed after a single use. Diaphragm  A diaphragm is a soft, dome-shaped barrier. It is inserted  into the vagina before sex, along with a spermicide. The diaphragm blocks sperm from entering the uterus, and the spermicide kills sperm. A diaphragm should be left in the vagina for 6-8 hours after sex and removed within 24 hours. A diaphragm is prescribed and fitted by a health care provider. A diaphragm should be replaced every 1-2 years, after giving birth, after gaining more than 15 lb (6.8 kg), and after pelvic surgery. Cervical cap  A cervical cap is a round, soft latex or plastic cup that fits over the cervix. It is inserted into the vagina before sex, along with spermicide. It  blocks sperm from entering the uterus. The cap should be left in place for 6-8 hours after sex and removed within 48 hours. A cervical cap must be prescribed and fitted by a health care provider. It should be replaced every 2 years. Sponge  A sponge is a soft, circular piece of polyurethane foam with spermicide on it. The sponge helps block sperm from entering the uterus, and the spermicide kills sperm. To use it, you make it wet and then insert it into the vagina. It should be inserted before sex, left in for at least 6 hours after sex, and removed and thrown away within 30 hours. Spermicides Spermicides are chemicals that kill or block sperm from entering the cervix and uterus. They can come as a cream, jelly, suppository, foam, or tablet. A spermicide should be inserted into the vagina with an applicator at least 10-15 minutes before sex to allow time for it to work. The process must be repeated every time you have sex. Spermicides do not require a prescription. Intrauterine contraception Intrauterine device (IUD) An IUD is a T-shaped device that is put in a woman's uterus. There are two types:  Hormone IUD.This type contains progestin, a synthetic form of the hormone progesterone. This type can stay in place for 3-5 years.  Copper IUD.This type is wrapped in copper wire. It can stay in place for 10  years.  Permanent methods of contraception Female tubal ligation In this method, a woman's fallopian tubes are sealed, tied, or blocked during surgery to prevent eggs from traveling to the uterus. Hysteroscopic sterilization In this method, a small, flexible insert is placed into each fallopian tube. The inserts cause scar tissue to form in the fallopian tubes and block them, so sperm cannot reach an egg. The procedure takes about 3 months to be effective. Another form of birth control must be used during those 3 months. Female sterilization This is a procedure to tie off the tubes that carry sperm (vasectomy). After the procedure, the man can still ejaculate fluid (semen). Natural planning methods Natural family planning In this method, a couple does not have sex on days when the woman could become pregnant. Calendar method This means keeping track of the length of each menstrual cycle, identifying the days when pregnancy can happen, and not having sex on those days. Ovulation method In this method, a couple avoids sex during ovulation. Symptothermal method This method involves not having sex during ovulation. The woman typically checks for ovulation by watching changes in her temperature and in the consistency of cervical mucus. Post-ovulation method In this method, a couple waits to have sex until after ovulation. Summary  Contraception, also called birth control, means methods or devices that prevent pregnancy.  Hormonal methods of contraception include implants, injections, pills, patches, vaginal rings, and emergency contraceptives.  Barrier methods of contraception can include female condoms, female condoms, diaphragms, cervical caps, sponges, and spermicides.  There are two types of IUDs (intrauterine devices). An IUD can be put in a woman's uterus to prevent pregnancy for 3-5 years.  Permanent sterilization can be done through a procedure for males, females, or both.  Natural  family planning methods involve not having sex on days when the woman could become pregnant. This information is not intended to replace advice given to you by your health care provider. Make sure you discuss any questions you have with your health care provider. Document Revised: 06/28/2017 Document Reviewed: 07/29/2016 Elsevier Patient Education  2020 [employer].

## 2019-11-24 ENCOUNTER — Telehealth: Payer: Medicaid Other | Admitting: Women's Health

## 2019-12-03 ENCOUNTER — Telehealth (INDEPENDENT_AMBULATORY_CARE_PROVIDER_SITE_OTHER): Payer: Medicaid Other | Admitting: Advanced Practice Midwife

## 2019-12-03 ENCOUNTER — Encounter: Payer: Self-pay | Admitting: Advanced Practice Midwife

## 2019-12-03 DIAGNOSIS — F32A Depression, unspecified: Secondary | ICD-10-CM | POA: Insufficient documentation

## 2019-12-03 DIAGNOSIS — O99345 Other mental disorders complicating the puerperium: Secondary | ICD-10-CM | POA: Diagnosis not present

## 2019-12-03 DIAGNOSIS — F53 Postpartum depression: Secondary | ICD-10-CM | POA: Diagnosis not present

## 2019-12-03 MED ORDER — SERTRALINE HCL 25 MG PO TABS: 25.0000 mg | ORAL_TABLET | Freq: Every day | ORAL | 3 refills | Status: DC

## 2019-12-03 NOTE — Progress Notes (Signed)
 TELEHEALTH VIRTUAL POSTPARTUM VISIT ENCOUNTER NOTE Patient name: Sheila Griffith MRN [id number]  Date of birth: 01/08/1996  I connected with patient on 12/03/19 at  2:10 PM EDT by MyChart and verified that I am speaking with the correct person using two identifiers. Due to COVID-19 recommendations, pt is not currently in our office but is at her home; I am in the office.    I discussed the limitations, risks, security and privacy concerns of performing an evaluation and management service by telephone and the availability of in person appointments. I also discussed with the patient that there may be a patient responsible charge related to this service. The patient expressed understanding and agreed to proceed.  Chief Complaint:   Postpartum Care  History of Present Illness:   Sheila Griffith is a 23 y.o. G3P1021 Caucasian female being evaluated today for a postpartum visit. She is 7 weeks postpartum following a spontaneous vaginal delivery at 40.1 gestational weeks. Anesthesia: epidural. I have fully reviewed the prenatal and intrapartum course. Pregnancy uncomplicated. Postpartum course has been complicated by feelings of frustration when infant is unable to be calmed; no HI/SI thoughts; has a lot of support and folks who are aware of how she is feeling and who can help and support her; she is requesting medication to help her though this time. Bleeding no bleeding. Bowel function is normal. Bladder function is normal.  Patient is sexually active. Last sexual activity: in last week.   No LMP recorded. Just finished first menses since delivery.  Baby's course has been uncomplicated. Baby is feeding by bottle   Edinburgh Postpartum Depression Screening: positive Edinburgh Postnatal Depression Scale - 12/03/19 1359      Edinburgh Postnatal Depression Scale:  In the Past 7 Days   I have been able to laugh and see the funny side of things.  0    I have looked forward with enjoyment to things.   0    I have blamed myself unnecessarily when things went wrong.  2    I have been anxious or worried for no good reason.  2    I have felt scared or panicky for no good reason.  0    Things have been getting on top of me.  0    I have been so unhappy that I have had difficulty sleeping.  0    I have felt sad or miserable.  2    I have been so unhappy that I have been crying.  3    The thought of harming myself has occurred to me.  0    Edinburgh Postnatal Depression Scale Total  9      Review of Systems:   Pertinent items are noted in HPI Denies Abnormal vaginal discharge w/ itching/odor/irritation, headaches, visual changes, shortness of breath, chest pain, abdominal pain, severe nausea/vomiting, or problems with urination or bowel movements. Pertinent History Reviewed:  Reviewed past medical,surgical, obstetrical and family history.  Reviewed problem list, medications and allergies. OB History  Gravida Para Term Preterm AB Living  3 1 1   2 1  SAB TAB Ectopic Multiple Live Births  2     0 1    # Outcome Date GA Lbr Len/2nd Weight Sex Delivery Anes PTL Lv  3 Term 10/11/19 [redacted]w[redacted]d 10:08 / 01:45 7 lb 7.6 oz (3.39 kg) F Vag-Spont EPI  LIV  2 SAB 2018 [redacted]w[redacted]d         1 SAB 2016 [redacted]w[redacted]d            Physical Assessment:   Vitals:   12/03/19 1358  BP: 115/72  Pulse: 91  There is no height or weight on file to calculate BMI.       Physical Examination:  General:  Alert, oriented and cooperative.   Mental Status: Normal mood and affect perceived. Normal judgment and thought content.  Rest of physical exam deferred due to type of encounter       No results found for this or any previous visit (from the past 24 hour(s)).  Assessment & Plan:  1) Postpartum exam 2) Seven 1/2 wks s/p vag del 3) Bottlefeeding 4) Depression screening- positive; would like medication and declines counseling currently 5) Contraception counseling- content with condoms at present and is aware of the risk of  pregnancy with perfect and imperfect use  Essential components of care per ACOG recommendations:  1.  Mood and well being:  . Patient with positive depression screening today. If positive, discussed and offered pt counseling +/- meds, pt would like to try Zoloft.  Reviewed local resources for support.  . Pre-existing mental health disorders? No  . Patient does not use tobacco. If using tobacco we discussed reduction/cessation and risk of relapse . Substance use disorder? No  Refer as indicated   2. Infant care and feeding:  . Patient currently breastfeeding? No  If yes, discussed returning to work, pumping, breastfeeding-associated pain, guidance regarding return to fertility while lactating if not using another method. If needed, patient was provided with a letter to be allowed to pump q 2-3hrs to support lactation in a private location with access to a refrigerator to store breastmilk.   . 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 Infant has a pediatrician/family doctor? Yes  . Recommended that all caregivers be immunized for flu, pertussis and other preventable communicable diseases . Pt does have material needs met such as stable housing, utilities, food and diapers. If not, referred to local resources for help obtaining these.  3. Sexuality, contraception and birth spacing . Provided guidance regarding sexuality, management of dyspareunia, and resumption of intercourse . Patient does not know about a pregnancy in the future.  Desired family size is unknown.  . Discussed avoiding interpregnancy interval <6mths and recommended birth spacing of 18 months . Reviewed forms of contraception. Patient desires condoms.    4. Sleep and fatigue . Discussed coping options for fatigue and sleep disruption . Encouraged family/partner/community support of 4 hrs of uninterrupted sleep to help with mood and fatigue  5. Physical recovery  . Pt did not have  a cesarean section. If yes, assessed incisional pain and providing guidance on normal vs prolonged recovery . Patient had a periurethral laceration, perineal healing and pain reviewed.  . Patient has urinary incontinence? No, fecal incontinence? No If yes, discussed management and not referred to PT or uro/gyn as indicated  . Patient is safe to resume physical activity. Discussed attainment of healthy weight.  6.  Chronic disease management . Discussed pregnancy complications if any, and their implications for future childbearing and long-term maternal health. . Review recommendations for prevention of recurrent pregnancy complications, such as 17 hydroxyprogesterone caproate to reduce risk for recurrent PTB not applicable, or aspirin to reduce risk of preeclampsia not applicable. . Pt had GDM: No. If yes, 2hr GTT scheduled: No. . Reviewed medications and non-pregnant dosing including consideration of whether pt is breastfeeding using a reliable resource such as [employer]: not applicable . Referred for f/u w/ PCP   or subspecialist providers as indicated: not applicable  7. Health maintenance . Last pap smear 2017/2018 and results were normal> recommended she schedule a pap/physical as fits her schedule . Mammogram at 24yo or earlier if indicated  Meds:  Meds ordered this encounter  Medications  . sertraline (ZOLOFT) 25 MG tablet    Sig: Take 1 tablet (25 mg total) by mouth at bedtime.    Dispense:  30 tablet    Refill:  3    Order Specific Question:   Supervising Provider    Answer:   FERGUSON, JOHN [id number] [[id number]]    The patient was advised to call back or seek an in-person evaluation/go to the ED for any concerning postpartum symptoms.  I provided 15 minutes of non-face-to-face time during this encounter.  Follow-up: Return for 2-3wk virtual visit, postpartum depression f/u.; needs Pap/physical as well  No orders of the defined types were placed in this encounter.    D   CNM 12/03/2019 3:59 PM 

## 2019-12-24 ENCOUNTER — Encounter: Payer: Self-pay | Admitting: Advanced Practice Midwife

## 2019-12-24 ENCOUNTER — Telehealth (INDEPENDENT_AMBULATORY_CARE_PROVIDER_SITE_OTHER): Payer: Medicaid Other | Admitting: Advanced Practice Midwife

## 2019-12-24 ENCOUNTER — Encounter: Payer: Self-pay | Admitting: *Deleted

## 2019-12-31 ENCOUNTER — Encounter: Payer: Self-pay | Admitting: Advanced Practice Midwife

## 2019-12-31 ENCOUNTER — Telehealth (INDEPENDENT_AMBULATORY_CARE_PROVIDER_SITE_OTHER): Payer: Medicaid Other | Admitting: Advanced Practice Midwife

## 2019-12-31 VITALS — BP 117/73 | HR 86 | Ht 67.0 in

## 2019-12-31 DIAGNOSIS — O99345 Other mental disorders complicating the puerperium: Secondary | ICD-10-CM

## 2019-12-31 DIAGNOSIS — F53 Postpartum depression: Secondary | ICD-10-CM

## 2019-12-31 NOTE — Progress Notes (Signed)
TELEHEALTH VIRTUAL GYN VISIT ENCOUNTER NOTE Patient name: Sheila Griffith MRN [id number]  Date of birth: 1996/06/21  I connected with patient on 12/31/19 at 11:10 AM EDT by MyChart (pt was at home; I was in the office) and verified that I am speaking with the correct person using two identifiers.  Due to COVID-19 recommendations, pt is not currently in the office.    I discussed the limitations, risks, security and privacy concerns of performing an evaluation and management service by telephone and the availability of in person appointments. I also discussed with the patient that there may be a patient responsible charge related to this service. The patient expressed understanding and agreed to proceed.   Chief Complaint:   Follow-up (on post partum depression)  History of Present Illness:   Sheila Griffith is a 24 y.o. G71P1021 Caucasian female being evaluated today for f/u of Zoloft start for postpartum depression x 4wks ago. Reports that she is doing much better now! (PHQ-9 score was 0 today). Her mood is more even, she is crying less. The only concern is that she has noticed a decrease in appetite. She is able to eat dinner, but for breakfast and lunch she is often using [employer] instant drinks. Requests to continue at this dose of Zoloft and doesn't feel like she needs to add in counseling. She is very hesitant to have a Pap smear.     Depression screen [facility] 2/9 03/26/2019 02/17/2019  Decreased Interest 1 0  Down, Depressed, Hopeless 0 0  PHQ - 2 Score 1 0  Altered sleeping 3 -  Tired, decreased energy 3 -  Change in appetite 2 -  Feeling bad or failure about yourself  2 -  Trouble concentrating 0 -  Moving slowly or fidgety/restless 0 -  Suicidal thoughts 0 -  PHQ-9 Score 11 -    Patient's last menstrual period was 12/22/2019. The current method of family planning is condoms.  Last pap never.  Review of Systems:   Pertinent items are noted in HPI Denies fever/chills, dizziness,  headaches, visual disturbances, fatigue, shortness of breath, chest pain, abdominal pain, vomiting, abnormal vaginal discharge/itching/odor/irritation, problems with periods, bowel movements, urination, or intercourse unless otherwise stated above.  Pertinent History Reviewed:  Reviewed past medical,surgical, social, obstetrical and family history.  Reviewed problem list, medications and allergies. Physical Assessment:   Vitals:   12/31/19 1119  BP: 117/73  Pulse: 86  Height: 5\' 7"  (1.702 m)  Body mass index is 21.14 kg/m.       Physical Examination:   General:  Alert, oriented and cooperative.   Mental Status: Normal mood and affect perceived. Normal judgment and thought content.  Physical exam deferred due to nature of the encounter  No results found for this or any previous visit (from the past 24 hour(s)).  Assessment & Plan:  1) Postpartum depression> doing very well on Zoloft 25mg - will continue; will notify if appetite doesn't improve in 2-4wks  2) Hesitant to get Pap smear> issue rev'd including standard of health recommendation for Pap >21yo, q 56yrs for normal results; she is agreeable to having this done in 1 year  Meds: No orders of the defined types were placed in this encounter.   No orders of the defined types were placed in this encounter.   I discussed the assessment and treatment plan with the patient. The patient was provided an opportunity to ask questions and all were answered. The patient agreed with the plan and demonstrated an  understanding of the instructions.   The patient was advised to call back or seek an in-person evaluation/go to the ED if the symptoms worsen or if the condition fails to improve as anticipated.  I provided 10 minutes of non-face-to-face time during this encounter.   Return in about 1 year (around 12/30/2020) for Pap & Physical.  [facility] 12/31/2019 12:10 PM

## 2020-01-19 NOTE — Progress Notes (Signed)
Pt no showed.

## 2020-02-16 ENCOUNTER — Other Ambulatory Visit (INDEPENDENT_AMBULATORY_CARE_PROVIDER_SITE_OTHER): Payer: Medicaid Other | Admitting: Adult Health

## 2020-02-16 VITALS — BP 120/79 | HR 82 | Ht 67.0 in | Wt 124.0 lb

## 2020-02-16 DIAGNOSIS — R109 Unspecified abdominal pain: Secondary | ICD-10-CM | POA: Diagnosis not present

## 2020-02-16 DIAGNOSIS — N390 Urinary tract infection, site not specified: Secondary | ICD-10-CM

## 2020-02-16 DIAGNOSIS — R319 Hematuria, unspecified: Secondary | ICD-10-CM | POA: Diagnosis not present

## 2020-02-16 DIAGNOSIS — O99345 Other mental disorders complicating the puerperium: Secondary | ICD-10-CM

## 2020-02-16 DIAGNOSIS — F53 Postpartum depression: Secondary | ICD-10-CM

## 2020-02-16 LAB — POCT URINALYSIS DIPSTICK OB
Glucose, UA: NEGATIVE
Ketones, UA: NEGATIVE
Leukocytes, UA: NEGATIVE
Nitrite, UA: POSITIVE
POC,PROTEIN,UA: NEGATIVE

## 2020-02-16 MED ORDER — SERTRALINE HCL 50 MG PO TABS: 50.0000 mg | ORAL_TABLET | Freq: Every day | ORAL | 6 refills | Status: DC

## 2020-02-16 MED ORDER — SULFAMETHOXAZOLE-TRIMETHOPRIM 800-160 MG PO TABS: 1.0000 | ORAL_TABLET | Freq: Two times a day (BID) | ORAL | 0 refills | Status: DC

## 2020-02-16 NOTE — Progress Notes (Signed)
  Subjective:     Patient ID: Sheila Griffith, female   DOB: 21-Sep-1995, 24 y.o.   MRN: [id number]  HPI [name] is a 24 year old white female,divorced, with SO, G3P1021, in complaining of Urinary frequency and right flank pain and  Increased depression.She has 7 month old who is teething.  Work in appt.   Review of Systems  Has urinary frequency and UI for few days started AZO Has right flank pain for <1 day  Denies any fever or nausea and vomiting  Has increased depression, not every day, but feels overwhelmed at times Reviewed past medical,surgical, social and family history. Reviewed medications and allergies.     Objective:   Physical Exam BP 120/79 (BP Location: Right Arm, Patient Position: Sitting, Cuff Size: Normal)   Pulse 82   Ht 5\' 7"  (1.702 m)   Wt 124 lb (56.2 kg)   LMP 01/26/2020   Breastfeeding No   BMI 19.42 kg/m urine dipstick:+blood and nitrates. Skin warm and dry. Lungs: clear to ausculation bilaterally. Cardiovascular: regular rate and rhythm. +right CVAT  Upstream - 02/16/20 1111      Pregnancy Intention Screening   Does the patient want to become pregnant in the next year? No    Does the patient's partner want to become pregnant in the next year? Ok Either Way    Would the patient like to discuss contraceptive options today? No      Contraception Wrap Up   Current Method Female Condom    End Method Female Condom    Contraception Counseling Provided No         PHQ 9 score is 12, no SI or plans but some thoughts    Assessment:     1. Flank pain If pain increases, has any fever or nausea, call me, could have kidney stone   2. Urinary tract infection with hematuria, site unspecified Will send urine culture Will rx septra ds Meds ordered this encounter  Medications  . sertraline (ZOLOFT) 50 MG tablet    Sig: Take 1 tablet (50 mg total) by mouth daily.    Dispense:  30 tablet    Refill:  6    Order Specific Question:   Supervising Provider    Answer:    04/17/20, LUTHER H [[id number]]  . sulfamethoxazole-trimethoprim (BACTRIM DS) 800-160 MG tablet    Sig: Take 1 tablet by mouth 2 (two) times daily. Take 1 bid    Dispense:  14 tablet    Refill:  0    Order Specific Question:   Supervising Provider    Answer:   Despina Hidden, LUTHER H [[id number]]  Push fluids like water, apple juice lemon ade  3. Postpartum depression Will increase zoloft to 50 mg  Try to take time for self   Did mention counseling if desired  Plan:     Follow up in 4 weeks

## 2020-02-18 ENCOUNTER — Telehealth: Payer: Self-pay | Admitting: Adult Health

## 2020-02-18 NOTE — Telephone Encounter (Signed)
Patient called stating that she is having a reaction to a medication that she is on, pt states that she woke up and the left side of her face was numb, pt states that she thought she was having a stroke and call 911. The EMS let her know that she has no signs of a stroke and that she is okay. Pt states that there is defiantly something wrong, pt states that she stumbled over things and face numbness. Please contact pt

## 2020-02-18 NOTE — Telephone Encounter (Signed)
No flank pain now

## 2020-02-18 NOTE — Telephone Encounter (Signed)
She is feeling fine now, will take zoloft tonight but hold off on septra ds for now

## 2020-02-18 NOTE — Telephone Encounter (Signed)
Pt put on zoloft and bactrim. Was having facial numbness and stumbling over things. EMS checked her out and said that it wasn't a stroke. Pt concerned this is a reaction to her meds.

## 2020-02-19 ENCOUNTER — Telehealth: Payer: Self-pay | Admitting: Adult Health

## 2020-02-19 DIAGNOSIS — F39 Unspecified mood [affective] disorder: Secondary | ICD-10-CM

## 2020-02-19 LAB — URINE CULTURE

## 2020-02-19 NOTE — Telephone Encounter (Signed)
Patient stated that she felt like she was having a stroke after her medication zoloft was increased in strength. Patient stated she started taking back to one pill. Patient last dose was last night.

## 2020-02-20 ENCOUNTER — Other Ambulatory Visit: Payer: Self-pay | Admitting: Adult Health

## 2020-02-20 ENCOUNTER — Telehealth: Payer: Self-pay | Admitting: Adult Health

## 2020-02-20 MED ORDER — NITROFURANTOIN MONOHYD MACRO 100 MG PO CAPS: 100.0000 mg | ORAL_CAPSULE | Freq: Two times a day (BID) | ORAL | 0 refills | Status: DC

## 2020-02-20 MED ORDER — ESCITALOPRAM OXALATE 10 MG PO TABS: 10.0000 mg | ORAL_TABLET | Freq: Every day | ORAL | 2 refills | Status: DC

## 2020-02-20 NOTE — Progress Notes (Signed)
Stop zoloft and lets try lexapro

## 2020-02-20 NOTE — Telephone Encounter (Signed)
Left message to call me.

## 2020-02-23 ENCOUNTER — Telehealth: Payer: Self-pay | Admitting: Adult Health

## 2020-02-23 DIAGNOSIS — F53 Postpartum depression: Secondary | ICD-10-CM

## 2020-02-23 DIAGNOSIS — O99345 Other mental disorders complicating the puerperium: Secondary | ICD-10-CM

## 2020-02-23 NOTE — Telephone Encounter (Signed)
Left message that referral mad to integrated behavorial health and that hopefully you are taking lexapro. Can call me back if you want, but they will be calling you.

## 2020-03-01 ENCOUNTER — Telehealth: Payer: Self-pay | Admitting: Clinical

## 2020-03-01 NOTE — Telephone Encounter (Signed)
Attempt to follow up after referral to integrated behavioral health: Left HIPPA-compliant message to call back [name] from [employer] for [employer] at [facility] [phone] [facility] office).

## 2020-03-01 NOTE — BH Specialist Note (Signed)
Pt prefers to reschedule due to scheduling conflict to virtual visit 03/09/20@1 :15pm.

## 2020-03-01 NOTE — Addendum Note (Signed)
Addended by: [name] C on: 03/01/2020 01:05 PM   Modules accepted: Orders

## 2020-03-03 ENCOUNTER — Ambulatory Visit: Payer: Self-pay | Admitting: Clinical

## 2020-03-09 ENCOUNTER — Ambulatory Visit (INDEPENDENT_AMBULATORY_CARE_PROVIDER_SITE_OTHER): Payer: Medicaid Other | Admitting: Clinical

## 2020-03-09 ENCOUNTER — Other Ambulatory Visit: Payer: Self-pay

## 2020-03-09 DIAGNOSIS — O99345 Other mental disorders complicating the puerperium: Secondary | ICD-10-CM | POA: Diagnosis not present

## 2020-03-09 DIAGNOSIS — F39 Unspecified mood [affective] disorder: Secondary | ICD-10-CM | POA: Diagnosis not present

## 2020-03-09 NOTE — Addendum Note (Signed)
Addended by: [name] C on: 03/09/2020 04:31 PM   Modules accepted: Level of Service

## 2020-03-09 NOTE — BH Specialist Note (Signed)
Integrated Behavioral Health via Telemedicine Video (Caregility) Visit  03/09/2020 [name] [id number]  Number of Integrated Behavioral Health visits: 1 Session Start time: 1:18  Session End time: 1:57 Total time: 39 minutes  Referring Provider: Cyril Mourning, NP Type of Visit: Video Patient/Family location: Home [facility] Provider location: Center for [facility]  All persons participating in visit: Patient Sheila Griffith and [facility] [name]    Discussed confidentiality: Yes   I connected with [name] by a video enabled telemedicine application (Caregility) and verified that I am speaking with the correct person using two identifiers.    I discussed that engaging in this virtual visit, they consent to the provision of behavioral healthcare and the services will be billed under their insurance.   Patient and/or legal guardian expressed understanding and consented to virtual visit: Yes   PRESENTING CONCERNS: Patient and/or family reports the following symptoms/concerns: Pt states her primary concern today is feeling overwhelmed, anxious, panic attacks 3-4 times/week, developing a fear of going outside without her fiancee, increasing irritability and anger,  Duration of problem:Increase postpartum; Severity of problem: moderately severe  STRENGTHS (Protective Factors/Coping Skills): Good self-awareness; open to treatment  GOALS ADDRESSED: Patient will: 1.  Reduce symptoms of: agitation, anxiety, depression and stress  2.  Increase knowledge and/or ability of: healthy habits and self-management skills  3.  Demonstrate ability to: Increase healthy adjustment to current life circumstances, Increase adequate support systems for patient/family and Increase motivation to adhere to plan of care  INTERVENTIONS: Interventions utilized:  Mindfulness or Management consultant, Medication Monitoring, Psychoeducation and/or Health  Education and Link to [employer] Standardized Assessments completed: Not given today  ASSESSMENT: Patient currently experiencing Mood disorder, unspecified.   Patient may benefit from psychoeducation and brief therapeutic interventions regarding coping with symptoms of anxiety and depression .  PLAN: 1. Follow up with behavioral health clinician on Two weeks 2. Behavioral recommendations:  -Continue taking Lexapro as prescribed -CALM relaxation breathing twice daily (morning; at bedtime with sleep sounds) -Put "Stop, Breathe and Think" app on phone, and use throughout the day as needed -Consider registering for and attending a new mom support group at www.postpartum.net for additional support 3. Referral(s): Integrated [facility] manager (In Clinic) and [employer]:  new mom support  I discussed the assessment and treatment plan with the patient and/or parent/guardian. They were provided an opportunity to ask questions and all were answered. They agreed with the plan and demonstrated an understanding of the instructions.   They were advised to call back or seek an in-person evaluation if the symptoms worsen or if the condition fails to improve as anticipated.   Confirmed patient's address: Yes  Confirmed patient's phone number: Yes  Any changes to demographics: No   Confirmed patient's insurance: Yes  Any changes to patient's insurance: No   I discussed the limitations of evaluation and management by telemedicine and the availability of in person appointments.  I discussed that the purpose of this visit is to provide behavioral health care while limiting exposure to the novel coronavirus.   Discussed there is a possibility of technology failure and discussed alternative modes of communication if that failure occurs.  [name]  Depression screen [facility] 2/9 02/16/2020 03/26/2019 02/17/2019  Decreased Interest 2 1 0  Down, Depressed, Hopeless 0 0 0  PHQ -  2 Score 2 1 0  Altered sleeping 3 3 -  Tired, decreased energy 3 3 -  Change in appetite  1 2 -  Feeling bad or failure about yourself  1 2 -  Trouble concentrating 1 0 -  Moving slowly or fidgety/restless 0 0 -  Suicidal thoughts 1 0 -  PHQ-9 Score 12 11 -  Difficult doing work/chores Somewhat difficult - -

## 2020-03-09 NOTE — Patient Instructions (Signed)
[facility] [address] [place], [place] [address] [phone] (main office) [phone] (Zasha Belleau's office)  /Emotional Wellbeing Apps and Websites Here are a few free apps meant to help you to help yourself.  To find, try searching on the internet to see if the app is offered on Apple/Android devices. If your first choice doesn't come up on your device, the good news is that there are many choices! Play around with different apps to see which ones are helpful to you.    Calm This is an app meant to help increase calm feelings. Includes info, strategies, and tools for tracking your feelings.      Calm Harm  This app is meant to help with self-harm. Provides many 5-minute or 15-min coping strategies for doing instead of hurting yourself.       Healthy Minds Health Minds is a problem-solving tool to help deal with emotions and cope with stress you encounter wherever you are.      MindShift This app can help people cope with anxiety. Rather than trying to avoid anxiety, you can make an important shift and face it.      MY3  MY3 features a support system, safety plan and resources with the goal of offering a tool to use in a time of need.       My Life My Voice  This mood journal offers a simple solution for tracking your thoughts, feelings and moods. Animated emoticons can help identify your mood.       Relax Melodies Designed to help with sleep, on this app you can mix sounds and meditations for relaxation.      Smiling Mind Smiling Mind is meditation made easy: it's a simple tool that helps put a smile on your mind.        Stop, Breathe & Think  A friendly, simple guide for people through meditations for mindfulness and compassion.  Stop, Breathe and Think Kids Enter your current feelings and choose a "mission" to help you cope. Offers videos for certain moods instead of just sound recordings.       Team  Orange The goal of this tool is to help teens change how they think, act, and react. This app helps you focus on your own good feelings and experiences.      The Virtual Hope Box The Virtual Hope Box (VHB) contains simple tools to help patients with coping, relaxation, distraction, and positive thinking.    Coping with Panic Attacks   What is a panic attack?  You may have had a panic attack if you experienced four or more of the symptoms listed below coming on abruptly and peaking in about 10 minutes.  Panic Symptoms   . Pounding heart  . Sweating  . Trembling or shaking  . Shortness of breath  . Feeling of choking  . Chest pain  . Nausea or abdominal distress    . Feeling dizzy, unsteady, lightheaded, or faint  . Feelings of unreality or being detached from yourself  . Fear of losing control or going crazy  . Fear of dying  . Numbness or tingling  . Chills or hot flashes      Panic attacks are sometimes accompanied by avoidance of certain places or situations. These are often situations that would be difficult to escape from or in which help might not be available. Examples might include crowded shopping malls, public transportation, restaurants, or driving.   Why   do panic attacks occur?   Panic attacks are the body's alarm system gone awry. All of us have a built-in alarm system, powered by adrenaline, which increases our heart rate, breathing, and blood flow in response to danger. Ordinarily, this 'danger response system' works well. In some people, however, the response is either out of proportion to whatever stress is going on, or may come out of the blue without any stress at all.   For example, if you are walking in the woods and see a bear coming your way, a variety of changes occur in your body to prepare you to either fight the danger or flee from the situation. Your heart rate will increase to get more blood flow around your body, your breathing rate will quicken so  that more oxygen is available, and your muscles will tighten in order to be ready to fight or run. You may feel nauseated as blood flow leaves your stomach area and moves into your limbs. These bodily changes are all essential to helping you survive the dangerous situation. After the danger has passed, your body functions will begin to go back to normal. This is because your body also has a system for "recovering" by bringing your body back down to a normal state when the danger is over.   As you can see, the emergency response system is adaptive when there is, in fact, a "true" or "real" danger (e.g., bear). However, sometimes people find that their emergency response system is triggered in "everyday" situations where there really is no true physical danger (e.g., in a meeting, in the grocery store, while driving in normal traffic, etc.).   What triggers a panic attack?  Sometimes particularly stressful situations can trigger a panic attack. For example, an argument with your spouse or stressors at work can cause a stress response (activating the emergency response system) because you perceive it as threatening or overwhelming, even if there is no direct risk to your survival.  Sometimes panic attacks don't seem to be triggered by anything in particular- they may "come out of the blue". Somehow, the natural "fight or flight" emergency response system has gotten activated when there is no real danger. Why does the body go into "emergency mode" when there is no real danger?   Often, people with panic attacks are frightened or alarmed by the physical sensations of the emergency response system. First, unexpected physical sensations are experienced (tightness in your chest or some shortness of breath). This then leads to feeling fearful or alarmed by these symptoms ("Something's wrong!", "Am I having a heart attack?", "Am I going to faint?") The mind perceives that there is a danger even though no real danger  exists. This, in turn, activates the emergency response system ("fight or flight"), leading to a "full blown" panic attack. In summary, panic attacks occur when we misinterpret physical symptoms as signs of impending death, craziness, loss of control, embarrassment, or fear of fear. Sometimes you may be aware of thoughts of danger that activate the emergency response system (for example, thinking "I'm having a heart attack" when you feel chest pressure or increased heart rate). At other times, however, you may not be aware of such thoughts. After several incidences of being afraid of physical sensations, anxiety and panic can occur in response to the initial sensations without conscious thoughts of danger. Instead, you just feel afraid or alarmed. In other words, the panic or fear may seem to occur "automatically" without you consciously telling yourself   anything.   After having had one or more panic attacks, you may also become more focused on what is going on inside your body. You may scan your body and be more vigilant about noticing any symptoms that might signal the start of a panic attack. This makes it easier for panic attacks to happen again because you pick up on sensations you might otherwise not have noticed, and misinterpret them as something dangerous. A panic attack may then result.      How do I cope with panic attacks?  An important part of overcoming panic attacks involves re-interpreting your body's physical reactions and teaching yourself ways to decrease the physical arousal. This can be done through practicing the cognitive and behavioral interventions below.   Research has found that over half of people who have panic attacks show some signs of hyperventilation or overbreathing. This can produce initial sensations that alarm you and lead to a panic attack. Overbreathing can also develop as part of the panic attack and make the symptoms worse. When people hyperventilate, certain blood  vessels in the body become narrower. In particular, the brain may get slightly less oxygen. This can lead to the symptoms of dizziness, confusion, and lightheadedness that often occur during panic attacks. Other parts of the body may also get a bit less oxygen, which may lead to numbness or tingling in the hands or feet or the sensation of cold, clammy hands. It also may lead the heart to pump harder. Although these symptoms may be frightening and feel unpleasant, it is important to remember that hyperventilating is not dangerous. However, you can help overcome the unpleasantness of overbreathing by practicing Breathing Retraining.   Practice this basic technique three times a day, every day:  . Inhale. With your shoulders relaxed, inhale as slowly and deeply as you can while you count to six. If you can, use your diaphragm to fill your lungs with air.  . Hold. Keep the air in your lungs as you slowly count to four.  . Exhale. Slowly breath out as you count to six.  . Repeat. Do the inhale-hold-exhale cycle several times. Each time you do it, exhale for longer counts.  Like any new skill, Breathing Retraining requires practice. Try practicing this skill twice a day for several minutes. Initially, do not try this technique in specific situations or when you become frightened or have a panic attack. Begin by practicing in a quiet environment to build up your skill level so that you can later use it in time of "emergency."   2. Decreasing Avoidance  Regardless of whether you can identify why you began having panic attacks or whether they seemed to come out of the blue, the places where you began having panic attacks often can become triggers themselves. It is not uncommon for individuals to begin to avoid the places where they have had panic attacks. Over time, the individual may begin to avoid more and more places, thereby decreasing their activities and often negatively impacting their quality of life. To  break the cycle of avoidance, it is important to first identify the places or situations that are being avoided, and then to do some "relearning."  To begin this intervention, first create a list of locations or situations that you tend to avoid. Then choose an avoided location or situation that you would like to target first. Now develop an "exposure hierarchy" for this situation or location. An "exposure hierarchy" is a list of actions that make   you feel anxious in this situation. Order these actions from least to most anxiety-producing. It is often helpful to have the first item on your hierarchy involve thinking or imagining part of the feared/avoided situation.   Here is an example of an exposure hierarchy for decreasing avoidance of the grocery store. Note how it is ordered from the least amount of anxiety (at the top) to the most anxiety (at the bottom):  . Think about going to the grocery store alone.  . Go to the grocery store with a friend or family member.  . Go to the grocery store alone to pick up a few small items (5-10 minutes in the store).  . Shopping for 10-20 minutes in the store alone.  . Doing the shopping for the week by myself (20-30 minutes in the store).   Your homework is to "expose" yourself to the lowest item on your hierarchy and use your breathing relaxation and coping statements (see below) to help you remain in the situation. Practice this several times during the upcoming week. Once you have mastered each item with minimal anxiety, move on to the next higher action on your list.   Cognitive Interventions  1. Identify your negative self-talk Anxious thoughts can increase anxiety symptoms and panic. The first step in changing anxious thinking is to identify your own negative, alarming self-talk. Some common alarming thoughts:  . I'm having a heart attack.            . I must be going crazy. . I think I'm dying. . People will think I'm crazy. . I'm going to pass our.   . Oh no- here it comes.  . I can't stand this.  . I've got to get out of here!  2. Use positive coping statements Changing or disrupting a pattern of anxious thoughts by replacing them with more calming or supportive statements can help to divert a panic attack. Some common helpful coping statements:  . This is not an emergency.  . I don't like feeling this way, but I can accept it.  . I can feel like this and still be okay.  . This has happened before, and I was okay. I'll be okay this time, too.  . I can be anxious and still deal with this situation.        

## 2020-03-16 ENCOUNTER — Other Ambulatory Visit: Payer: Self-pay

## 2020-03-16 ENCOUNTER — Telehealth (INDEPENDENT_AMBULATORY_CARE_PROVIDER_SITE_OTHER): Payer: Medicaid Other | Admitting: Adult Health

## 2020-03-16 ENCOUNTER — Encounter: Payer: Self-pay | Admitting: Adult Health

## 2020-03-16 DIAGNOSIS — O99345 Other mental disorders complicating the puerperium: Secondary | ICD-10-CM | POA: Diagnosis not present

## 2020-03-16 DIAGNOSIS — F53 Postpartum depression: Secondary | ICD-10-CM | POA: Diagnosis not present

## 2020-03-16 MED ORDER — ESCITALOPRAM OXALATE 10 MG PO TABS: 10.0000 mg | ORAL_TABLET | Freq: Every day | ORAL | 6 refills | Status: DC

## 2020-03-16 NOTE — Progress Notes (Signed)
Patient ID: Sheila Griffith, female   DOB: 08/18/1995, 24 y.o.   MRN: [id number]   TELEHEALTH GYNECOLOGY VISIT ENCOUNTER NOTE  I connected with Sheila Griffith on 03/16/20 at 10:50 AM EDT by telephone at home and verified that I am speaking with the correct person using two identifiers.   I discussed the limitations, risks, security and privacy concerns of performing an evaluation and management service by telephone and the availability of in person appointments. I also discussed with the patient that there may be a patient responsible charge related to this service. The patient expressed understanding and agreed to proceed.   History:  Sheila Griffith is a 24 y.o. [phone] female being evaluated today for taking lexapro, an has talked with Sheila Griffith, and has follow up 03/23/20. She denies any SI or other concerns.  She has fever today with sinus infection she says.      Past Medical History:  Diagnosis Date   Anxiety    Asthma    childhood   Eczema    Past Surgical History:  Procedure Laterality Date   DENTAL SURGERY     The following portions of the patient's history were reviewed and updated as appropriate: allergies, current medications, past family history, past medical history, past social history, past surgical history and problem list.   Health Maintenance: no pap in epic.  Review of Systems:  Pertinent items noted in HPI and remainder of comprehensive ROS otherwise negative.  Physical Exam:   General:  Alert, oriented and cooperative.   Mental Status: Normal mood and affect perceived. Normal judgment and thought content.  Physical exam deferred due to nature of the encounter PHQ 9 score is 10, no SI, score was 12 on 02/16/20   Labs and Imaging No results found for this or any previous visit (from the past 336 hour(s)). No results found.    Assessment and Plan:      1. Postpartum depression Continue lexapro  Meds ordered this encounter  Medications   escitalopram  (LEXAPRO) 10 MG tablet    Sig: Take 1 tablet (10 mg total) by mouth daily.    Dispense:  30 tablet    Refill:  6    Order Specific Question:   Supervising Provider    Answer:   Lazaro Arms [[id number]]  Follow  up in 3 months or sooner if needed Keep follow up with [facility] 03/23/20.      I discussed the assessment and treatment plan with the patient. The patient was provided an opportunity to ask questions and all were answered. The patient agreed with the plan and demonstrated an understanding of the instructions.   The patient was advised to call back or seek an in-person evaluation/go to the ED if the symptoms worsen or if the condition fails to improve as anticipated.  I provided 5  minutes of non-face-to-face time during this encounter.   Sheila Mourning, NP [facility] [employer], [facility]

## 2020-03-22 NOTE — BH Specialist Note (Signed)
Integrated Behavioral Health via Telemedicine  Visit  03/22/2020 Sheila Griffith [id number]  Number of Integrated Behavioral Health visits: 2 Session Start time: 1:17  Session End time: 1:56 Total time: 39 minutes  Referring Provider: Cyril Mourning, NP Type of Service: Individual Patient/Family location: Home [facility] Provider location: [facility]  All persons participating in visit: Patient Ambriella Griffith and [facility] [name]     I connected with [name]  by a video enabled telemedicine application ([employer]) and verified that I am speaking with the correct person using two identifiers.   Discussed confidentiality: at previous visit  Confirmed demographics & insurance:  Yes   I discussed that engaging in this virtual visit, they consent to the provision of behavioral healthcare and the services will be billed under their insurance.   Patient and/or legal guardian expressed understanding and consented to virtual visit: Yes   PRESENTING CONCERNS: Patient and/or family reports the following symptoms/concerns: Pt states she is taking Lexapro as prescribed and using self-coping strategies (apps, relaxation breathing, healing crystals) that have all helped to manage her mood; her primary concern is escalating anger/irritability .  Duration of problem: Ongoing; Severity of problem: moderately severe  STRENGTHS (Protective Factors/Coping Skills): Good self-awareness, adhering to treatment; motivated for change  ASSESSMENT: Patient currently experiencing Mood disorder.    GOALS ADDRESSED: Patient will: 1.  Reduce symptoms of: agitation, anxiety, depression and stress  2.  Increase knowledge and/or ability of: self-management skills  3.  Demonstrate ability to: Increase healthy adjustment to current life circumstances and Increase motivation to adhere to plan of care  Progress of  Goals: Ongoing  INTERVENTIONS: Interventions utilized:  Brief CBT Standardized Assessments completed & reviewed: Mood Disorder Questionnaire   OUTCOME: Patient Response: Pt agrees to treatment plan   PLAN: 1. Follow up with behavioral health clinician on : One month 2. Behavioral recommendations:  -Track mood on calendar as discussed (red for irritable days, blue for calm days, etc.); note any potential triggers that day (what helped, what hurt) -Continue taking Lexapro as prescribed -Continue using self-coping strategies (apps, relaxation breathing, healing crystals) -Accept referral to  Psychiatry ([facility]) 3. Referral(s): Integrated [employer] (In Clinic)  I discussed the assessment and treatment plan with the patient and/or parent/guardian. They were provided an opportunity to ask questions and all were answered. They agreed with the plan and demonstrated an understanding of the instructions.   They were advised to call back or seek an in-person evaluation as appropriate.  I discussed that the purpose of this visit is to provide behavioral health care while limiting exposure to the novel coronavirus.  Discussed there is a possibility of technology failure and discussed alternative modes of communication if that failure occurs.  Valetta Close Arbie Reisz

## 2020-03-23 ENCOUNTER — Other Ambulatory Visit: Payer: Self-pay

## 2020-03-23 ENCOUNTER — Ambulatory Visit (INDEPENDENT_AMBULATORY_CARE_PROVIDER_SITE_OTHER): Payer: Medicaid Other | Admitting: Clinical

## 2020-03-23 DIAGNOSIS — F39 Unspecified mood [affective] disorder: Secondary | ICD-10-CM

## 2020-03-23 NOTE — Patient Instructions (Signed)
[facility] [address] [place], [place] [address] [phone] (main office) [phone] (Markos Theil's office)   

## 2020-04-11 DIAGNOSIS — J209 Acute bronchitis, unspecified: Secondary | ICD-10-CM | POA: Diagnosis not present

## 2020-04-11 DIAGNOSIS — J45901 Unspecified asthma with (acute) exacerbation: Secondary | ICD-10-CM | POA: Diagnosis not present

## 2020-04-19 NOTE — BH Specialist Note (Signed)
Pt did not arrive to video visit and did not answer the phone Voicemail is not set up so unable to leave a voice message; ; left MyChart message for patient.

## 2020-04-20 ENCOUNTER — Ambulatory Visit: Payer: Medicaid Other | Admitting: Clinical

## 2020-04-20 DIAGNOSIS — Z91199 Patient's noncompliance with other medical treatment and regimen due to unspecified reason: Secondary | ICD-10-CM

## 2020-04-20 DIAGNOSIS — Z5329 Procedure and treatment not carried out because of patient's decision for other reasons: Secondary | ICD-10-CM

## 2020-06-15 ENCOUNTER — Ambulatory Visit: Payer: Medicaid Other | Admitting: Adult Health

## 2020-12-20 ENCOUNTER — Other Ambulatory Visit (INDEPENDENT_AMBULATORY_CARE_PROVIDER_SITE_OTHER): Payer: Medicaid Other | Admitting: *Deleted

## 2020-12-20 ENCOUNTER — Other Ambulatory Visit: Payer: Self-pay

## 2020-12-20 ENCOUNTER — Other Ambulatory Visit (HOSPITAL_COMMUNITY)
Admission: RE | Admit: 2020-12-20 | Discharge: 2020-12-20 | Disposition: A | Discharge status: Home / Self Care | Payer: Medicaid Other | Source: Ambulatory Visit | Attending: Obstetrics & Gynecology | Admitting: Obstetrics & Gynecology

## 2020-12-20 DIAGNOSIS — R109 Unspecified abdominal pain: Secondary | ICD-10-CM | POA: Diagnosis not present

## 2020-12-20 DIAGNOSIS — N898 Other specified noninflammatory disorders of vagina: Secondary | ICD-10-CM | POA: Diagnosis not present

## 2020-12-20 NOTE — Progress Notes (Signed)
ft 

## 2020-12-20 NOTE — Progress Notes (Signed)
   NURSE VISIT- VAGINITIS/STD  SUBJECTIVE:  Sheila Griffith is a 25 y.o. [id number] GYN patientfemale here for a vaginal swab for vaginitis screening, STD screen.  She reports the following symptoms: cramping, discharge described as white, odor, and vulvar itching for "several months"  Denies abnormal vaginal bleeding, significant pelvic pain, fever, or UTI symptoms.  OBJECTIVE:  There were no vitals taken for this visit.  Appears well, in no apparent distress  ASSESSMENT: Vaginal swab for vaginitis screening, STD screening  PLAN: Self-collected vaginal probe for Gonorrhea, Chlamydia, Trichomonas, Bacterial Vaginosis, Yeast sent to lab Treatment: to be determined once results are received Follow-up as needed if symptoms persist/worsen, or new symptoms develop  Jobe Marker  12/20/2020 3:15 PM

## 2020-12-23 LAB — CERVICOVAGINAL ANCILLARY ONLY
Bacterial Vaginitis (gardnerella): NEGATIVE
Candida Glabrata: NEGATIVE
Candida Vaginitis: POSITIVE — AB
Chlamydia: NEGATIVE
Comment: NEGATIVE
Comment: NEGATIVE
Comment: NEGATIVE
Comment: NEGATIVE
Comment: NEGATIVE
Comment: NORMAL
Neisseria Gonorrhea: NEGATIVE
Trichomonas: NEGATIVE

## 2020-12-27 ENCOUNTER — Other Ambulatory Visit: Payer: Self-pay | Admitting: Adult Health

## 2020-12-27 MED ORDER — FLUCONAZOLE 150 MG PO TABS: ORAL_TABLET | ORAL | 1 refills | Status: DC

## 2020-12-27 NOTE — Progress Notes (Signed)
Will rx diflucan  

## 2021-01-11 ENCOUNTER — Other Ambulatory Visit: Payer: Self-pay | Admitting: Adult Health

## 2021-03-21 ENCOUNTER — Encounter: Payer: Self-pay | Admitting: Women's Health

## 2021-03-21 ENCOUNTER — Other Ambulatory Visit (HOSPITAL_COMMUNITY)
Admission: RE | Admit: 2021-03-21 | Discharge: 2021-03-21 | Disposition: A | Discharge status: Home / Self Care | Payer: Medicaid Other | Source: Ambulatory Visit | Attending: Women's Health | Admitting: Women's Health

## 2021-03-21 ENCOUNTER — Ambulatory Visit (INDEPENDENT_AMBULATORY_CARE_PROVIDER_SITE_OTHER): Payer: Medicaid Other | Admitting: Women's Health

## 2021-03-21 ENCOUNTER — Other Ambulatory Visit: Payer: Self-pay

## 2021-03-21 VITALS — BP 137/88 | HR 83 | Ht 67.0 in | Wt 126.0 lb

## 2021-03-21 DIAGNOSIS — Z3009 Encounter for other general counseling and advice on contraception: Secondary | ICD-10-CM

## 2021-03-21 DIAGNOSIS — N898 Other specified noninflammatory disorders of vagina: Secondary | ICD-10-CM | POA: Diagnosis not present

## 2021-03-21 DIAGNOSIS — Z131 Encounter for screening for diabetes mellitus: Secondary | ICD-10-CM

## 2021-03-21 DIAGNOSIS — Z113 Encounter for screening for infections with a predominantly sexual mode of transmission: Secondary | ICD-10-CM

## 2021-03-21 DIAGNOSIS — Z01419 Encounter for gynecological examination (general) (routine) without abnormal findings: Secondary | ICD-10-CM

## 2021-03-21 DIAGNOSIS — Z1329 Encounter for screening for other suspected endocrine disorder: Secondary | ICD-10-CM | POA: Diagnosis not present

## 2021-03-21 DIAGNOSIS — Z124 Encounter for screening for malignant neoplasm of cervix: Secondary | ICD-10-CM | POA: Diagnosis not present

## 2021-03-21 DIAGNOSIS — Z1321 Encounter for screening for nutritional disorder: Secondary | ICD-10-CM | POA: Diagnosis not present

## 2021-03-21 DIAGNOSIS — Z0000 Encounter for general adult medical examination without abnormal findings: Secondary | ICD-10-CM | POA: Diagnosis not present

## 2021-03-21 MED ORDER — ESCITALOPRAM OXALATE 20 MG PO TABS: 20.0000 mg | ORAL_TABLET | Freq: Every day | ORAL | 6 refills | Status: DC

## 2021-03-21 NOTE — Progress Notes (Signed)
WELL-WOMAN EXAMINATION Patient name: Sheila Griffith MRN [id number]  Date of birth: August 20, 1995 Chief Complaint:   Annual Exam  History of Present Illness:   Sheila Griffith is a 25 y.o. G19P1021 Caucasian female being seen today for a routine well-woman exam.  Current complaints: depression, on lexapro 10mg , not helping. Did at first. Had a baby about 1.67yrs ago, had bad pp depression, was on zoloft then dose increased and had weakness/numbness Lt side of body in middle of night- went to ED and was told it was a panic attack. Had 2 visits w/ Jamie/IBH after baby, doesn't feel like it helped much. Does not have much support, husband tried to understand but she doesn't feel she can talk to him/tell him everything. He and her daughter are her main reasons for wanting to live. Does have thoughts of being better off gone, but no plan to harm self. Has some weeks where she has a lot of energy and feels better, then energy goes back down and feels depressed again. Denies ever being dx as bipolar.  Frequent yeast infections, odor/irritation, hasn't used any otc meds, was scared to make it worse.  Pain in Rt ovary 'for awhile', comes and goes, some w/ sex  PCP: none      does desire labs Patient's last menstrual period was 02/24/2021. The current method of family planning is condoms. Is interested in non-hormonal or even permanent method of birth control, does not want any more children.  Last pap 2017. Results were: negative per pt report at [facility] .  Last mammogram: never. Results were: N/A. Family h/o breast cancer: yes MGM- older Last colonoscopy: never. Results were: N/A. Family h/o colorectal cancer: no  Depression screen [facility] 2/9 03/21/2021 03/16/2020 02/16/2020 03/26/2019 02/17/2019  Decreased Interest 1 2 2 1  0  Down, Depressed, Hopeless 1 1 0 0 0  PHQ - 2 Score 2 3 2 1  0  Altered sleeping 3 3 3 3  -  Tired, decreased energy 3 3 3 3  -  Change in appetite 3 0 1 2 -  Feeling bad or failure about  yourself  1 1 1 2  -  Trouble concentrating 3 0 1 0 -  Moving slowly or fidgety/restless 0 0 0 0 -  Suicidal thoughts 1 0 1 0 -  PHQ-9 Score 16 10 12 11  -  Difficult doing work/chores - Somewhat difficult Somewhat difficult - -     GAD 7 : Generalized Anxiety Score 03/21/2021  Nervous, Anxious, on Edge 2  Control/stop worrying 3  Worry too much - different things 3  Trouble relaxing 3  Restless 3  Easily annoyed or irritable 3  Afraid - awful might happen 3  Total GAD 7 Score 20     Review of Systems:   Pertinent items are noted in HPI Denies any headaches, blurred vision, fatigue, shortness of breath, chest pain, abdominal pain, abnormal vaginal discharge/itching/odor/irritation, problems with periods, bowel movements, urination, or intercourse unless otherwise stated above. Pertinent History Reviewed:  Reviewed past medical,surgical, social and family history.  Reviewed problem list, medications and allergies. Physical Assessment:   Vitals:   03/21/21 0936  BP: 137/88  Pulse: 83  Weight: 126 lb (57.2 kg)  Height: 5\' 7"  (1.702 m)  Body mass index is 19.73 kg/m.        Physical Examination:   General appearance - well appearing, and in no distress  Mental status - alert, oriented to person, place, and time  Psych:  She has a normal  mood and affect  Skin - warm and dry, normal color, no suspicious lesions noted  Chest - effort normal, all lung fields clear to auscultation bilaterally  Heart - normal rate and regular rhythm  Neck:  midline trachea, no thyromegaly or nodules  Breasts - breasts appear normal, no suspicious masses, no skin or nipple changes or  axillary nodes  Abdomen - soft, nontender, nondistended, no masses or organomegaly  Pelvic - VULVA: normal appearing vulva with no masses, tenderness or lesions  VAGINA: normal appearing vagina with normal color and discharge, no lesions  CERVIX: normal appearing cervix without discharge or lesions, no CMT  Thin prep  pap is done w/ HR HPV cotesting  UTERUS: uterus is felt to be normal size, shape, consistency and nontender   ADNEXA: No adnexal masses noted, mild tenderness RLQ  Extremities:  No swelling or varicosities noted  Chaperone: Peggy Dones    No results found for this or any previous visit (from the past 24 hour(s)).  Assessment & Plan:  1) Well-Woman Exam  2) Depression> w/ thoughts of being better off gone, no plan. Symptoms of bipolar disorder. Discussed I'm not MH provider, can't dx or tx that. Gave [facility] info- advised to call or go there today for appt. Gave Jamie/IBH number to call if wants to have another appt w/ her, but definitely needs to contact [facility]. Increased Lexapro to 20mg  (since it did help some in the beginning). F/U here in 4wks. Promises to go to ED if begins to develops plan to harm self.   3) Contraception counseling> may be interested in BTL, discussed high incidence regret <30yo, but if 100% certain can make appt w/ MD. Discussed non-hormonal options, gave 1 box of Phexxi samples. Can use w/ condoms.   4) Vaginal irritation/odor> pt feels like is having frequent yeast infections, will see what pap shows  5) Rt lower pelvic pain> if pap neg for all, will get pelvic u/s  6) STD screen> gc/ct/trich from pap  Labs/procedures today: as below  Mammogram: @ 26yo, or sooner if problems Colonoscopy: @ 26yo, or sooner if problems  Orders Placed This Encounter  Procedures   CBC   Comprehensive metabolic panel   TSH   Hemoglobin A1c   VITAMIN D 25 Hydroxy (Vit-D Deficiency, Fractures)    Meds:  Meds ordered this encounter  Medications   escitalopram (LEXAPRO) 20 MG tablet    Sig: Take 1 tablet (20 mg total) by mouth daily.    Dispense:  30 tablet    Refill:  6    Order Specific Question:   Supervising Provider    Answer:   [[id number]]    Follow-up: Return in about 4 weeks (around 04/18/2021) for med f/u, with me, in person.  06/18/2021 CNM,  [facility] 03/21/2021 10:25 AM

## 2021-03-21 NOTE — Patient Instructions (Addendum)
Primary Care Providers Dr. Dwana Melena [place]) [phone] [facility] Primary Care [phone] Primary Wellness Care [facility]) Dr. Karilyn Cota [phone] [facility]) [phone] [facility] [place]) [phone] [facility] [place]) [phone] [facility] [phone] [facility] Family Medicine [phone]    [name] [phone]  [facility]  [address] [place], [place] [address] Phone: [phone] Hours: Mon-Fri 8AM-5PM  If you're a walk-in patient there is generally a wait time involved on a "first come, first served basis" otherwise contact us beforehand to setup an appointment. If you're in crisis please use our 24-Hour Crisis Hotline for immediate access to a clinician. 24 Hour Crisis Line: [phone]   If this is your first time please bring the following with you: Insurance/Medicaid Card ID or Social Security Card Any referral documentation  Payment Options Individuals with Medicaid have no obligation for a copay. Individuals with Medicare or private insurance will be obligated to meet their policy's requirement(s). Individuals who are uninsured will be eligible for a sliding or discounted scaled as defined by the relevant Managed Care Organization/Local Management Entity  Services:  Substance Abuse Outpatient Treatment Mental Health Outpatient Treatment Psychiatric Services/ Medical Services Advanced Access/Walk-In Clinic Mobile Crisis Management Services ACTT (Assertive Community Treatment Team) Dialectic Behavioral Therapy Critical Time Intervention Psychosocial Rehabilitation [facility]) Medication Assisted Treatment

## 2021-03-22 LAB — CBC
Hematocrit: 43.4 % (ref 34.0–46.6)
Hemoglobin: 15.2 g/dL (ref 11.1–15.9)
MCH: 31.1 pg (ref 26.6–33.0)
MCHC: 35 g/dL (ref 31.5–35.7)
MCV: 89 fL (ref 79–97)
Platelets: 253 10*3/uL (ref 150–450)
RBC: 4.88 x10E6/uL (ref 3.77–5.28)
RDW: 13.4 % (ref 11.7–15.4)
WBC: 8.7 10*3/uL (ref 3.4–10.8)

## 2021-03-22 LAB — COMPREHENSIVE METABOLIC PANEL
ALT: 9 IU/L (ref 0–32)
AST: 15 IU/L (ref 0–40)
Albumin/Globulin Ratio: 1.9 (ref 1.2–2.2)
Albumin: 4.8 g/dL (ref 3.9–5.0)
Alkaline Phosphatase: 69 IU/L (ref 44–121)
BUN/Creatinine Ratio: 9 (ref 9–23)
BUN: 8 mg/dL (ref 6–20)
Bilirubin Total: 0.5 mg/dL (ref 0.0–1.2)
CO2: 20 mmol/L (ref 20–29)
Calcium: 9.9 mg/dL (ref 8.7–10.2)
Chloride: 104 mmol/L (ref 96–106)
Creatinine, Ser: 0.94 mg/dL (ref 0.57–1.00)
Globulin, Total: 2.5 g/dL (ref 1.5–4.5)
Glucose: 80 mg/dL (ref 65–99)
Potassium: 4 mmol/L (ref 3.5–5.2)
Sodium: 138 mmol/L (ref 134–144)
Total Protein: 7.3 g/dL (ref 6.0–8.5)
eGFR: 86 mL/min/{1.73_m2} (ref >59)

## 2021-03-22 LAB — HEMOGLOBIN A1C
Est. average glucose Bld gHb Est-mCnc: 105 mg/dL
Hgb A1c MFr Bld: 5.3 % (ref 4.8–5.6)

## 2021-03-22 LAB — TSH: TSH: 1.81 u[IU]/mL (ref 0.450–4.500)

## 2021-03-22 LAB — VITAMIN D 25 HYDROXY (VIT D DEFICIENCY, FRACTURES): Vit D, 25-Hydroxy: 39 ng/mL (ref 30.0–100.0)

## 2021-03-29 ENCOUNTER — Encounter: Payer: Self-pay | Admitting: Women's Health

## 2021-03-29 ENCOUNTER — Other Ambulatory Visit: Payer: Self-pay | Admitting: Women's Health

## 2021-03-29 DIAGNOSIS — R87619 Unspecified abnormal cytological findings in specimens from cervix uteri: Secondary | ICD-10-CM | POA: Insufficient documentation

## 2021-03-29 DIAGNOSIS — R102 Pelvic and perineal pain: Secondary | ICD-10-CM

## 2021-03-29 LAB — CYTOLOGY - PAP
Chlamydia: NEGATIVE
Comment: NEGATIVE
Comment: NEGATIVE
Comment: NEGATIVE
Comment: NEGATIVE
Comment: NORMAL
Diagnosis: UNDETERMINED — AB
HPV 16: NEGATIVE
HPV 18 / 45: NEGATIVE
High risk HPV: POSITIVE — AB
Neisseria Gonorrhea: NEGATIVE
Trichomonas: NEGATIVE

## 2021-03-31 ENCOUNTER — Telehealth: Payer: Self-pay | Admitting: Women's Health

## 2021-03-31 NOTE — Telephone Encounter (Signed)
Patient stated that the increase of lexapro has given her insomia and making her ocdc worse. She wanted to talk to a nurse about it.

## 2021-04-04 ENCOUNTER — Telehealth: Payer: Medicaid Other | Admitting: Women's Health

## 2021-04-06 ENCOUNTER — Telehealth (INDEPENDENT_AMBULATORY_CARE_PROVIDER_SITE_OTHER): Payer: Medicaid Other | Admitting: Women's Health

## 2021-04-06 ENCOUNTER — Encounter: Payer: Self-pay | Admitting: Women's Health

## 2021-04-06 DIAGNOSIS — F32A Depression, unspecified: Secondary | ICD-10-CM | POA: Diagnosis not present

## 2021-04-06 NOTE — Progress Notes (Signed)
TELEHEALTH VIRTUAL GYN VISIT ENCOUNTER NOTE Patient name: Sheila Griffith MRN [id number]  Date of birth: 01-25-1996  I connected with patient on 04/06/21 at  8:50 AM EDT by MyChart video  and verified that I am speaking with the correct person using two identifiers.  Pt is not currently in the office, she is at home.  Provider is in the office.    I discussed the limitations, risks, security and privacy concerns of performing an evaluation and management service by telephone and the availability of in person appointments. I also discussed with the patient that there may be a patient responsible charge related to this service. The patient expressed understanding and agreed to proceed.   Chief Complaint:   Insomnia (Feels like Lexapro 20 mg is too much for her, making her feel funny and she is having trouble with sleep. )  History of Present Illness:   Sheila Griffith is a 25 y.o. G38P1021 Caucasian female being evaluated today to discuss lexapro. Was increased to 20mg  on 9/12 for depression- she feels it is too much for her, makes her feel off, having problems sleeping. Wants to go back to 10mg . Truly feels she has bipolar disease, hasn't seen [facility] yet as advised. But realizes she needs to. No SI/HI/II, but just feels she doesn't want to deal with things/thoughts she would be better off gone.     Depression screen [facility] 2/9 04/06/2021 03/21/2021 03/16/2020 02/16/2020 03/26/2019  Decreased Interest 2 1 2 2 1   Down, Depressed, Hopeless 1 1 1  0 0  PHQ - 2 Score 3 2 3 2 1   Altered sleeping 0 3 3 3 3   Tired, decreased energy 3 3 3 3 3   Change in appetite 3 3 0 1 2  Feeling bad or failure about yourself  0 1 1 1 2   Trouble concentrating 0 3 0 1 0  Moving slowly or fidgety/restless 0 0 0 0 0  Suicidal thoughts 1 1 0 1 0  PHQ-9 Score 10 16 10 12 11   Difficult doing work/chores - - Somewhat difficult Somewhat difficult -    No LMP recorded. The current method of family planning is condoms and phexxi .   Last pap 03/21/21. Results were:  ASCUS w/ HRHPV positive: other (not 16, 18/45), has colpo scheduled for 10/10 Review of Systems:   Pertinent items are noted in HPI Denies fever/chills, dizziness, headaches, visual disturbances, fatigue, shortness of breath, chest pain, abdominal pain, vomiting, abnormal vaginal discharge/itching/odor/irritation, problems with periods, bowel movements, urination, or intercourse unless otherwise stated above.  Pertinent History Reviewed:  Reviewed past medical,surgical, social, obstetrical and family history.  Reviewed problem list, medications and allergies. Physical Assessment:  There were no vitals filed for this visit.There is no height or weight on file to calculate BMI.       Physical Examination:   General:  Alert, oriented and cooperative.   Mental Status: Normal mood and affect perceived. Normal judgment and thought content.  Physical exam deferred due to nature of the encounter  No results found for this or any previous visit (from the past 24 hour(s)).  Assessment & Plan:  1) Depression> decrease back to 10mg  lexapro, no current SI, go to [facility] today for walk-in appt, or call for appt to be assessed for bipolar, etc  Meds: No orders of the defined types were placed in this encounter.   No orders of the defined types were placed in this encounter.   I discussed the assessment and treatment plan  with the patient. The patient was provided an opportunity to ask questions and all were answered. The patient agreed with the plan and demonstrated an understanding of the instructions.   The patient was advised to call back or seek an in-person evaluation/go to the ED if the symptoms worsen or if the condition fails to improve as anticipated.  I provided 10 minutes of non-face-to-face time during this encounter.   Return for As scheduled 10/10 for colpo.  Cheral Marker CNM, [facility] 04/06/2021 9:31 AM

## 2021-04-11 ENCOUNTER — Telehealth: Payer: Self-pay | Admitting: Women's Health

## 2021-04-11 NOTE — Telephone Encounter (Signed)
Returned pt's call for clarification. Two identifiers used. Pt stated that the pain she has been experiencing has worsened to the point of feeling like she is 8 months pregnant with extreme pressure on her bladder. Pt is using heating pad, but has not taken any pain meds. Pt instructed to take some Tylenol and/or Advil. Note forwarded to Joellyn Haff for further advisement.

## 2021-04-11 NOTE — Telephone Encounter (Signed)
Called pt to relay Kim's response, no answer, left vm

## 2021-04-11 NOTE — Telephone Encounter (Signed)
Pt is scheduled for U/S & Colpo 04/18/2021 - states she's having a lot of lower abdominal pain for a couple days, feels pressure on bladder like she's pregnant  Would like to be seen this week, we have no appts for U/S with colpo after & Selena Batten is completely booked  ?suggestions  Please advise & notify pt

## 2021-04-18 ENCOUNTER — Other Ambulatory Visit: Payer: Self-pay

## 2021-04-18 ENCOUNTER — Encounter: Payer: Self-pay | Admitting: Women's Health

## 2021-04-18 ENCOUNTER — Other Ambulatory Visit (HOSPITAL_COMMUNITY)
Admission: RE | Admit: 2021-04-18 | Discharge: 2021-04-18 | Disposition: A | Discharge status: Home / Self Care | Payer: Medicaid Other | Source: Ambulatory Visit | Attending: Women's Health | Admitting: Women's Health

## 2021-04-18 ENCOUNTER — Ambulatory Visit (INDEPENDENT_AMBULATORY_CARE_PROVIDER_SITE_OTHER): Payer: Medicaid Other | Admitting: Women's Health

## 2021-04-18 ENCOUNTER — Ambulatory Visit (INDEPENDENT_AMBULATORY_CARE_PROVIDER_SITE_OTHER): Payer: Medicaid Other

## 2021-04-18 VITALS — BP 119/79 | HR 74 | Ht 67.0 in | Wt 127.0 lb

## 2021-04-18 DIAGNOSIS — G8929 Other chronic pain: Secondary | ICD-10-CM

## 2021-04-18 DIAGNOSIS — R87810 Cervical high risk human papillomavirus (HPV) DNA test positive: Secondary | ICD-10-CM | POA: Diagnosis not present

## 2021-04-18 DIAGNOSIS — Z3202 Encounter for pregnancy test, result negative: Secondary | ICD-10-CM

## 2021-04-18 DIAGNOSIS — R87610 Atypical squamous cells of undetermined significance on cytologic smear of cervix (ASC-US): Secondary | ICD-10-CM | POA: Insufficient documentation

## 2021-04-18 DIAGNOSIS — R102 Pelvic and perineal pain: Secondary | ICD-10-CM | POA: Diagnosis not present

## 2021-04-18 DIAGNOSIS — F418 Other specified anxiety disorders: Secondary | ICD-10-CM

## 2021-04-18 DIAGNOSIS — R1031 Right lower quadrant pain: Secondary | ICD-10-CM | POA: Diagnosis not present

## 2021-04-18 LAB — POCT URINE PREGNANCY: Preg Test, Ur: NEGATIVE

## 2021-04-18 NOTE — Patient Instructions (Addendum)
Recurrent BV/ Vaginitis Alternative Therapies  Both options are to be done after sexual intercourse, your period ends, and when you think you may have bacterial vaginosis (BV) or a yeast infection.  If symptoms persist you will need to schedule an appointment to be seen  1) Soak in tub of waist- high warm water with 1/2 cup of baking soda for at least 20 mins.  2) Soak 3 tampons in 1 tablespoon of fractionated (liquid form) coconut oil with 10 drops of Melaleuca (Tea Tree) essential oil, insert 1 saturated tampon vaginally at bedtime x 3 days.    You can purchase Melaleuca/Tea Tree oil online at [email or web address], with a [employer] or [employer] representative, or locally at:  [employer] [address] [place], [place] [address] [phone]  [facility] [address] [place], [place] [address] [phone]  You may also want to consider making the changes below:  Soap: Unscented Dove (white box light green writing)  Wash cloth: use a separate white washcloth for your genital area Laundry detergent: Dreft or unscented [employer] Hammer  Underwear: White 100% cotton panties (NOT just cotton crouch) Sanitary pads/tampons: Unscented only-If it doesn't SAY unscented it can have a scent/perfume    NO PERFUMES OR LOTIONS OR POTIONS in the genital area (may use regular [place]) Condoms: hypoallergenic only, non-dyed (no color) Toilet paper: White unscented only   Colposcopy, Care After This sheet gives you information about how to care for yourself after your procedure. Your health care provider may also give you more specific instructions. If you have problems or questions, contact your health care provider. What can I expect after the procedure? If you had a colposcopy without a biopsy, you can expect to feel fine right away after your procedure. However, you may have some spotting of blood for a few days. You can return to your normal activities. If you had a colposcopy with a  biopsy, it is common after the procedure to have: Soreness and mild pain. These may last for a few days. Light-headedness. Mild vaginal bleeding or discharge that is dark-colored and grainy. This may last for a few days. The discharge may be caused by a liquid (solution) that was used during the procedure. You may need to wear a sanitary pad during this time. Spotting of blood for at least 48 hours after the procedure. Follow these instructions at home: Medicines Take over-the-counter and prescription medicines only as told by your health care provider. Talk with your health care provider about what type of over-the-counter pain medicine and prescription medicine you can start to take again. It is especially important to talk with your health care provider if you take blood thinners. Activity Limit your physical activity for the first day after your procedure as told by your health care provider. Avoid using douche products, using tampons, or having sex for at least 3 days after the procedure or for as long as told. Return to your normal activities as told by your health care provider. Ask your health care provider what activities are safe for you. General instructions  Drink enough fluid to keep your urine pale yellow. Ask your health care provider if you may take baths, swim, or use a hot tub. You may take showers. If you use birth control (contraception), continue to use it. Keep all follow-up visits as told by your health care provider. This is important. Contact a health care provider if: You develop a skin rash. Get help right away if: You  bleed a lot from your vagina or pass blood clots. This includes using more than one sanitary pad each hour for 2 hours in a row. You have a fever or chills. You have vaginal discharge that is abnormal, is yellow in color, or smells bad. This could be a sign of infection. You have severe pain or cramps in your lower abdomen that do not go away with  medicine. You faint. Summary If you had a colposcopy without a biopsy, you can expect to feel fine right away, but you may have some spotting of blood for a few days. You can return to your normal activities. If you had a colposcopy with a biopsy, it is common to have mild pain for a few days and spotting for 48 hours after the procedure. Avoid using douche products, using tampons, and having sex for at least 3 days after the procedure or for as long as told by your health care provider. Get help right away if you have heavy bleeding, severe pain, or signs of infection. This information is not intended to replace advice given to you by your health care provider. Make sure you discuss any questions you have with your health care provider. Document Revised: 06/25/2019 Document Reviewed: 06/25/2019 Elsevier Patient Education  2022 [employer].

## 2021-04-18 NOTE — Progress Notes (Signed)
   COLPOSCOPY PROCEDURE NOTE Patient name: Sheila Griffith MRN [id number]  Date of birth: Mar 12, 1996 Subjective Findings:   Sheila Griffith is a 25 y.o. G38P1021 Caucasian female being seen today for a colposcopy. Also here for f/u after pelvic u/s today for chronic intermittent RLQ pain and dyspareunia. Pain has been going on for years, isn't a daily thing, more like every other day, does hurt w/ sex. Today's pelvic u/s normal. Discussed potential for endometriosis and trialing Orilissa or Myfrembree, wants to hold off for now, will let me know if she decides for this.  Has dep/anx, on Lexapro 10mg , had increased to 20mg  but made her feel funny, so she is back at 10mg , and feels her depression is actually a bit better. Has started taking melatonin at night to help her sleep, and thinks that has helped a lot. Hasn't gone to [facility] yet as we discussed, hasn't been able to find the time. Did try to call, but they said she would have to come in. Will try to get up there sometime soon. Doesn't feel she needs to f/u w/ me, will let me know if she needs anything. Discussed pap showed shift in flora suggestive of BV, reports odor off and on. Pap was neg gc/ct/trich. Does not have any odor or sx right now.   Colpo indication: Abnormal pap on 03/21/21: ASCUS w/ HRHPV positive: other (not 16, 18/45)  Prior cytology:  Date Result Procedure  2017 negative per pt report at [facility] None              Patient's last menstrual period was 03/26/2021 (exact date). Contraception:  condoms . Menopausal: no. Hysterectomy: no.   Smoker: former . Immunocompromised: no.   The risks and benefits were explained and informed consent was obtained, and written copy is in chart. Pertinent History Reviewed:   Reviewed past medical,surgical, social, obstetrical and family history.  Reviewed problem list, medications and allergies. Objective Findings & Procedure:   Vitals:   04/18/21 0854  BP: 119/79  Pulse: 74  Weight:  127 lb (57.6 kg)  Height: 5\' 7"  (1.702 m)  Body mass index is 19.89 kg/m.  Results for orders placed or performed in visit on 04/18/21 (from the past 24 hour(s))  POCT urine pregnancy   Collection Time: 04/18/21 10:13 AM  Result Value Ref Range   Preg Test, Ur Negative Negative     Time out was performed.  Speculum placed in the vagina, cervix fully visualized. SCJ: fully visualized. Cervix swabbed x 3 with acetic acid.  Acetowhitening present: Yes Cervix: no visible lesions, no mosaicism, no punctation, no abnormal vasculature, and light acetowhite lesion(s) noted at 5 & 9 o'clock. Cervical biopsies taken at 5 & 9 o'clock. Hemostasis achieved w/ Monsels.  Vagina: vaginal colposcopy not performed Vulva: vulvar colposcopy not performed  Specimens: 2  Complications: none  Chaperone: 06/18/21    Colposcopic Impression & Plan:   Colpo findings c/w HPV atypia/LSIL Plan: Post biopsy instructions given, Will notify patient of results when back, and Will base plan of care on pathology results and ASCCP guidelines  Depression> continue Lexapro 10mg , f/u w/ [facility]  RLQ pain> pelvic u/s normal, pain has been going on for years, so very unlikely appendicitis. Discussed potential for endometriosis, offered meds, will let me know if she decides she wants to try  Return in about 1 year (around 04/18/2022) for Pap & physical.  06/18/21 CNM, [facility] 04/18/2021 10:13 AM

## 2021-04-18 NOTE — Progress Notes (Signed)
PELVIC US TA/TV: homogeneous anteverted uterus,WNL,EEC 8.1 mm,normal ovaries,ovaries appear mobile,no free fluid,no pain during ultrasound

## 2021-04-19 LAB — SURGICAL PATHOLOGY

## 2021-05-17 IMAGING — US TRANSVAGINAL OB ULTRASOUND
1 series · 14 of 28 positions shown · non-contrast
Comparison: None.

CLINICAL DATA: Dating, unsure of LMP

EXAM:
TRANSVAGINAL OB ULTRASOUND
TECHNIQUE: Transvaginal ultrasound was performed for complete evaluation of the
gestation as well as the maternal uterus, adnexal regions, and
pelvic cul-de-sac.

[Series 1: transvaginal ob ultrasound · 0.11mm/px · 14 of 71 slices shown]
[im 3/71]
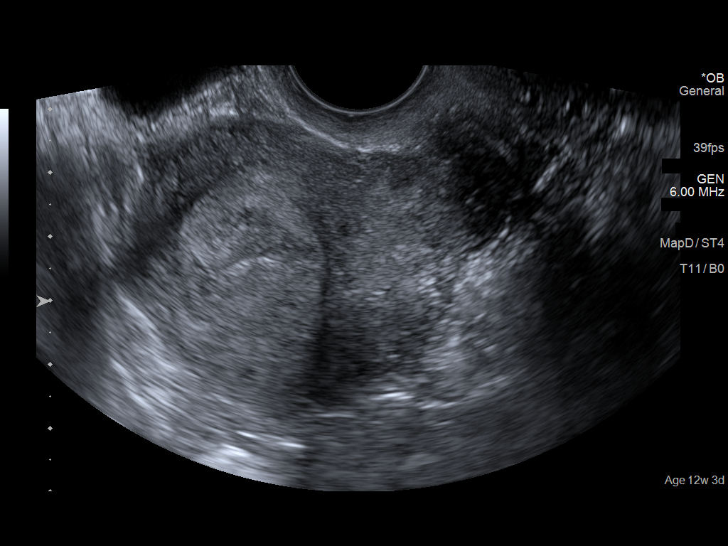
[im 8/71]
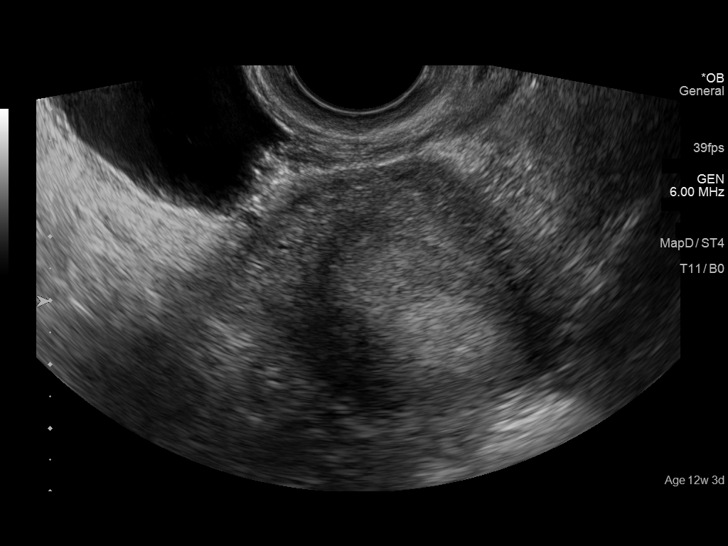
[im 13/71]
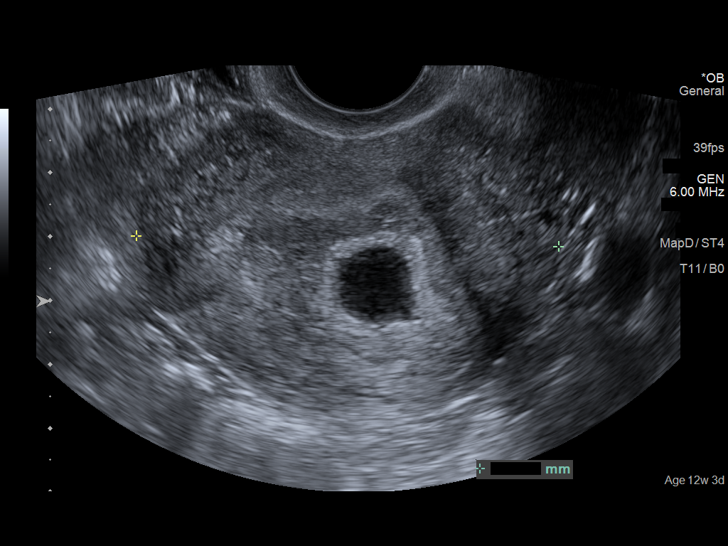
[im 19/71]
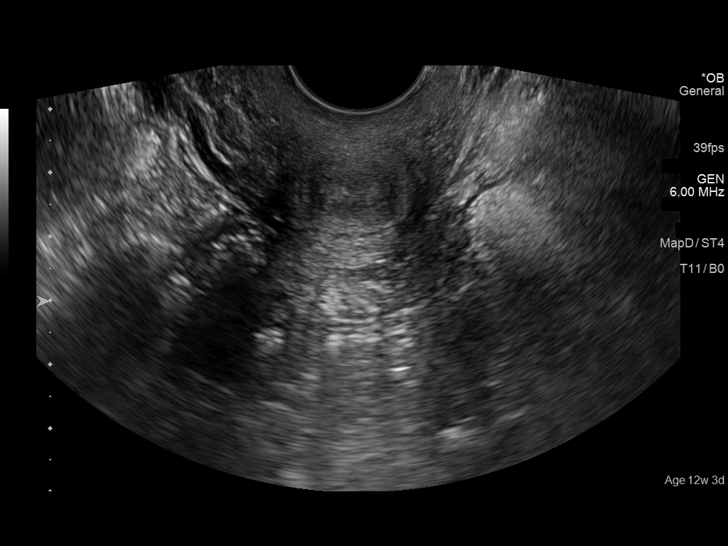
[im 24/71]
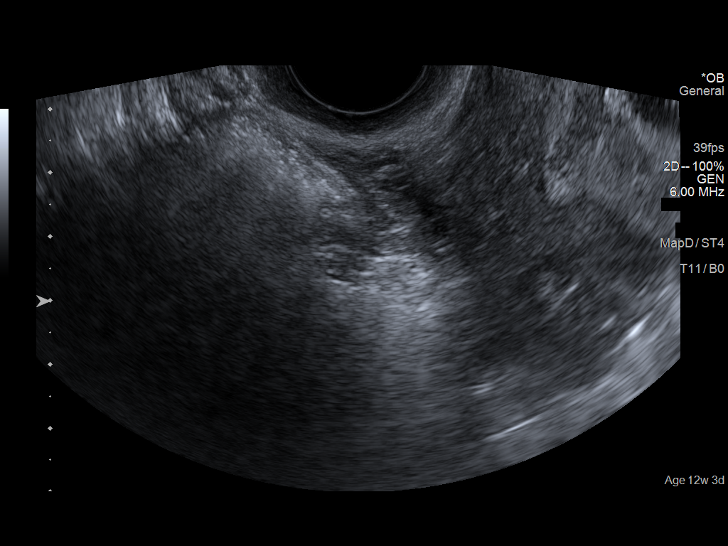
[im 29/71]
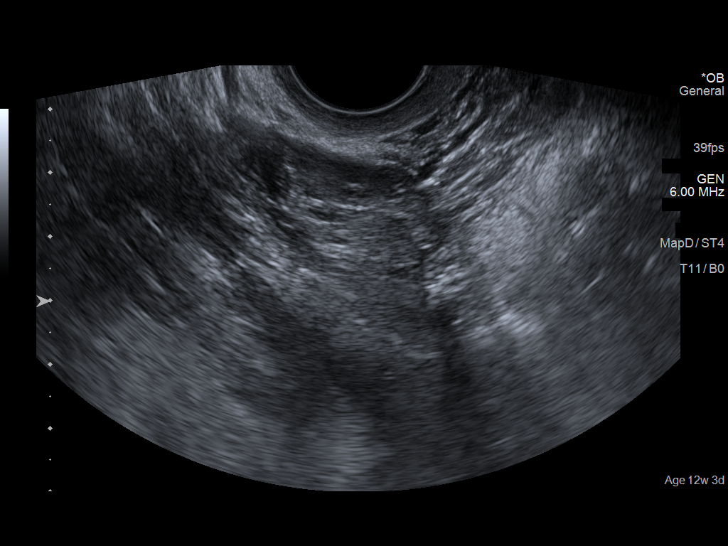
[im 34/71]
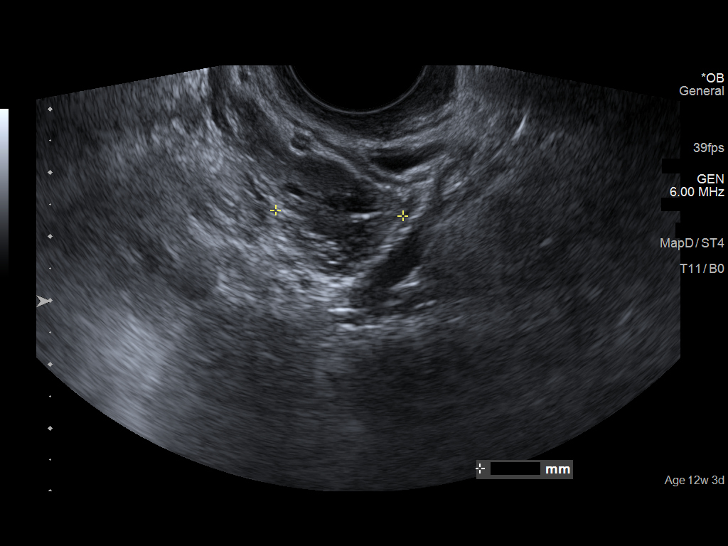
[im 39/71]
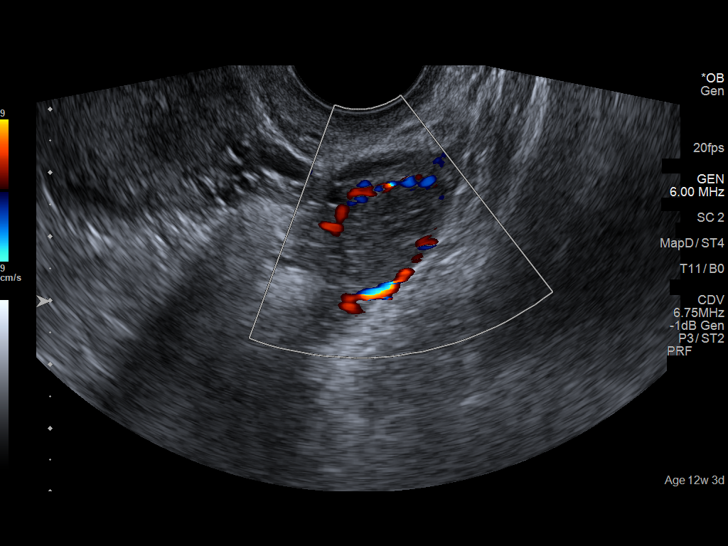
[im 45/71]
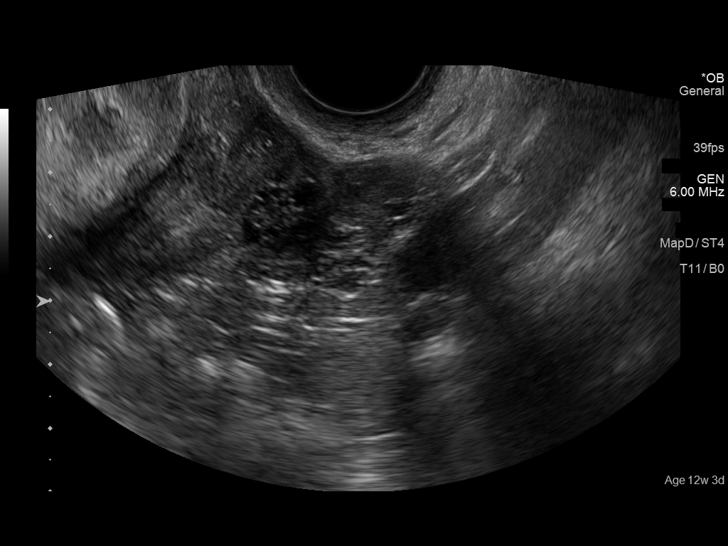
[im 50/71]
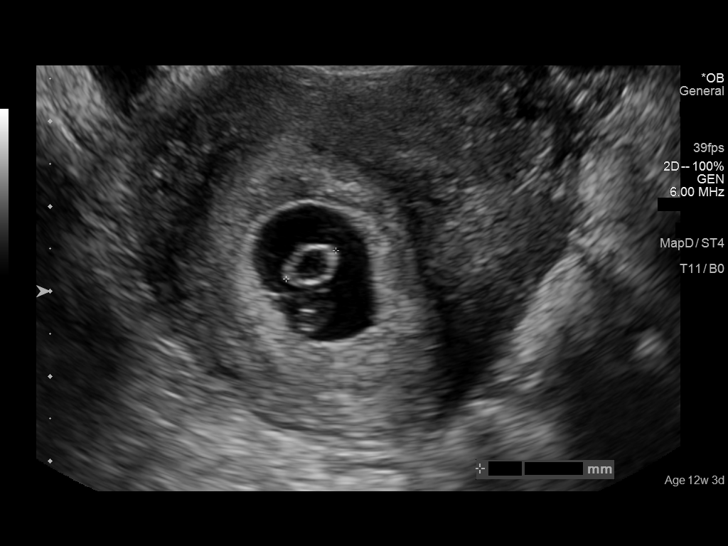
[im 55/71]
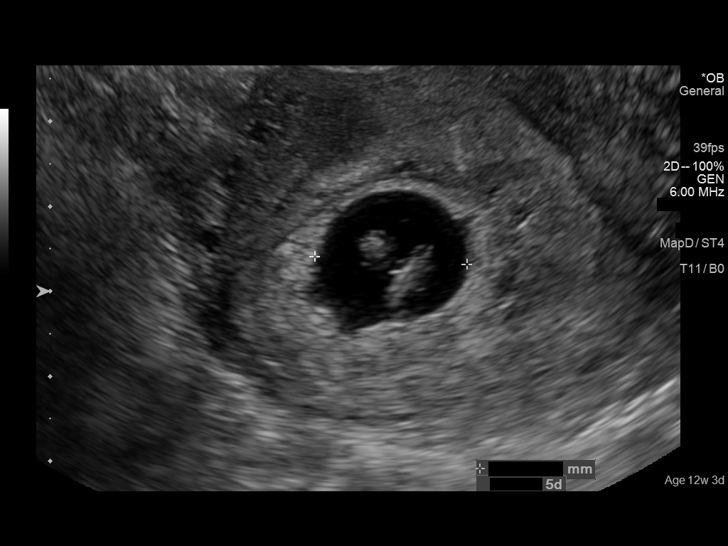
[im 60/71]
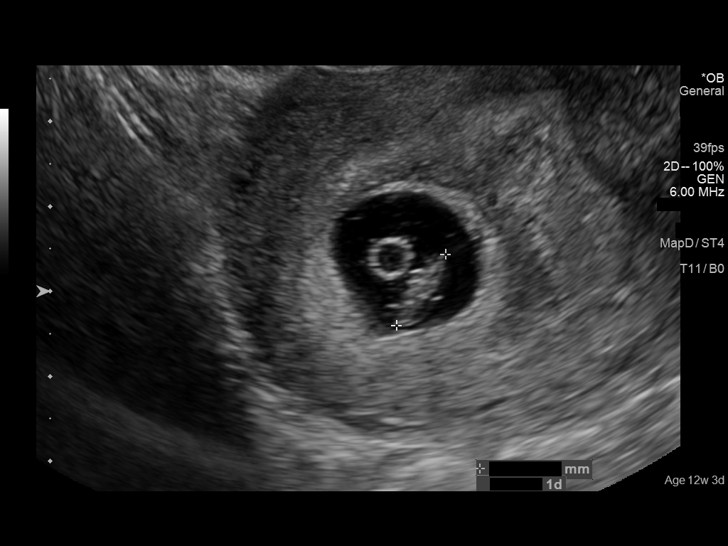
[im 65/71]
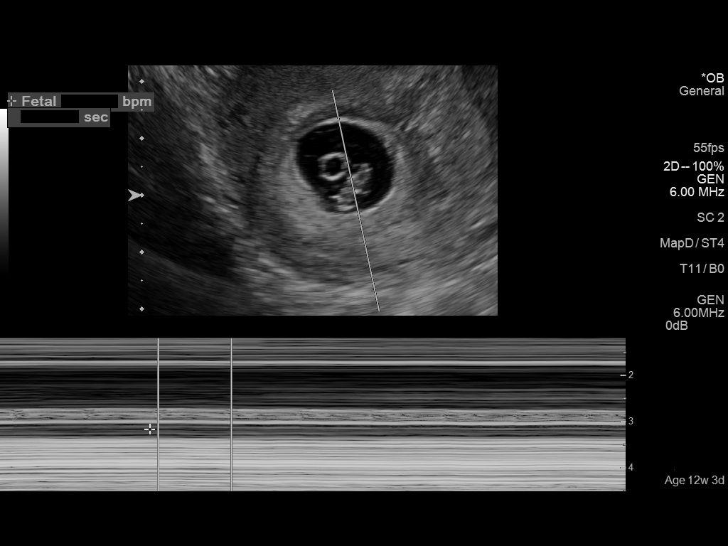
[im 71/71]
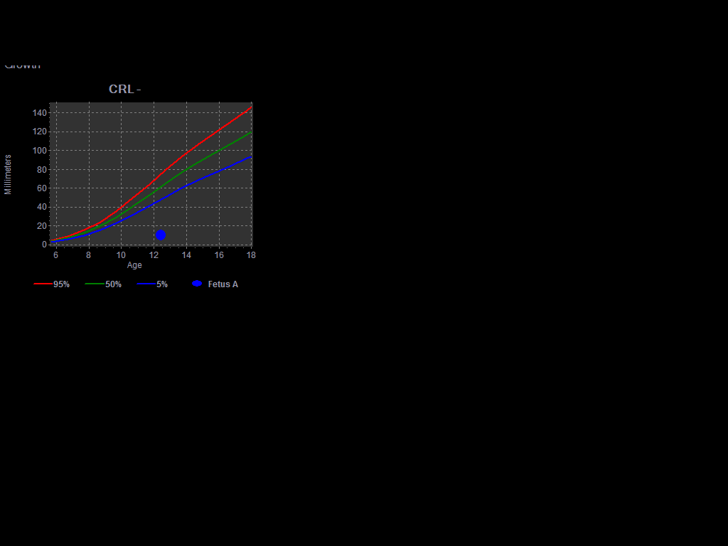

[14 of 28 positions shown; findings below may reference images not displayed]

FINDINGS: Intrauterine gestational sac: Single

Yolk sac:  Visualized.

Embryo:  Visualized.

Cardiac Activity: Visualized.

Heart Rate: 136 bpm

CRL: 10 mm 7 w 0 d                  US EDC: 10/10/2019

Subchorionic hemorrhage:  None visualized.

Maternal uterus/adnexae: Bilateral ovaries are within normal limits.
Left ovary measures 2.2 by 3.1 x 2.2 cm. The right ovary measures 2
x 3.7 x 1.9 cm. No significant free fluid.
IMPRESSION: Single viable intrauterine pregnancy with estimated sonographic age
of 7 weeks 0 days, EDC of 10/10/2019.

## 2021-11-21 ENCOUNTER — Encounter: Payer: Self-pay | Admitting: Women's Health

## 2022-02-06 ENCOUNTER — Encounter (HOSPITAL_COMMUNITY): Payer: Self-pay | Admitting: *Deleted

## 2022-02-06 ENCOUNTER — Other Ambulatory Visit: Payer: Self-pay

## 2022-02-06 ENCOUNTER — Emergency Department (HOSPITAL_COMMUNITY)
Admission: EM | Admit: 2022-02-06 | Discharge: 2022-02-06 | Disposition: A | Discharge status: Home / Self Care | Payer: Medicaid Other | Attending: Emergency Medicine | Admitting: Emergency Medicine

## 2022-02-06 ENCOUNTER — Emergency Department (HOSPITAL_COMMUNITY): Payer: Medicaid Other

## 2022-02-06 DIAGNOSIS — Z91010 Allergy to peanuts: Secondary | ICD-10-CM | POA: Diagnosis not present

## 2022-02-06 DIAGNOSIS — R202 Paresthesia of skin: Secondary | ICD-10-CM | POA: Diagnosis not present

## 2022-02-06 DIAGNOSIS — R0789 Other chest pain: Secondary | ICD-10-CM | POA: Insufficient documentation

## 2022-02-06 DIAGNOSIS — R002 Palpitations: Secondary | ICD-10-CM | POA: Diagnosis not present

## 2022-02-06 DIAGNOSIS — R200 Anesthesia of skin: Secondary | ICD-10-CM | POA: Diagnosis not present

## 2022-02-06 DIAGNOSIS — R079 Chest pain, unspecified: Secondary | ICD-10-CM | POA: Diagnosis not present

## 2022-02-06 LAB — BASIC METABOLIC PANEL
Anion gap: 5 (ref 5–15)
BUN: 8 mg/dL (ref 6–20)
CO2: 23 mmol/L (ref 22–32)
Calcium: 9.1 mg/dL (ref 8.9–10.3)
Chloride: 111 mmol/L (ref 98–111)
Creatinine, Ser: 0.78 mg/dL (ref 0.44–1.00)
GFR, Estimated: >60 mL/min (ref >60)
Glucose, Bld: 91 mg/dL (ref 70–99)
Potassium: 3.8 mmol/L (ref 3.5–5.1)
Sodium: 139 mmol/L (ref 135–145)

## 2022-02-06 LAB — CBC WITH DIFFERENTIAL/PLATELET
Abs Immature Granulocytes: 0.02 10*3/uL (ref 0.00–0.07)
Basophils Absolute: 0 10*3/uL (ref 0.0–0.1)
Basophils Relative: 0 %
Eosinophils Absolute: 0.2 10*3/uL (ref 0.0–0.5)
Eosinophils Relative: 2 %
HCT: 41 % (ref 36.0–46.0)
Hemoglobin: 14 g/dL (ref 12.0–15.0)
Immature Granulocytes: 0 %
Lymphocytes Relative: 9 %
Lymphs Abs: 0.7 10*3/uL (ref 0.7–4.0)
MCH: 30.5 pg (ref 26.0–34.0)
MCHC: 34.1 g/dL (ref 30.0–36.0)
MCV: 89.3 fL (ref 80.0–100.0)
Monocytes Absolute: 0.5 10*3/uL (ref 0.1–1.0)
Monocytes Relative: 6 %
Neutro Abs: 6.6 10*3/uL (ref 1.7–7.7)
Neutrophils Relative %: 83 %
Platelets: 230 10*3/uL (ref 150–400)
RBC: 4.59 MIL/uL (ref 3.87–5.11)
RDW: 13.4 % (ref 11.5–15.5)
WBC: 7.9 10*3/uL (ref 4.0–10.5)
nRBC: 0 % (ref 0.0–0.2)

## 2022-02-06 LAB — TROPONIN I (HIGH SENSITIVITY): Troponin I (High Sensitivity): <2 ng/L (ref <18)

## 2022-02-06 LAB — D-DIMER, QUANTITATIVE: D-Dimer, Quant: 0.41 ug/mL-FEU (ref 0.00–0.50)

## 2022-02-06 LAB — POC URINE PREG, ED: Preg Test, Ur: NEGATIVE

## 2022-02-06 MED ORDER — IBUPROFEN 800 MG PO TABS: 800.0000 mg | ORAL_TABLET | Freq: Three times a day (TID) | ORAL | 0 refills | Status: DC

## 2022-02-06 NOTE — ED Provider Notes (Signed)
[facility] EMERGENCY DEPARTMENT Provider Note   CSN: [id number] Arrival date & time: 02/06/22  0944     History  Chief Complaint  Patient presents with   Chest Pain    Sheila Griffith is a 26 y.o. female.   Chest Pain Associated symptoms: palpitations   Associated symptoms: no abdominal pain, no back pain, no dizziness, no fever, no headache, no nausea, no shortness of breath, no vomiting and no weakness         Sheila Griffith is a 26 y.o. female who presents to the Emergency Department complaining of left lateral chest pain since yesterday.  Describes sharp pain to the lateral left chest wall.  Pain worsens when she attempts to lie on either side, pain subsides when she is supine.  Pain has been associated with intermittent numbness and tingling sensation of her left arm and hand.  She denies any recent illness, cough, shortness of breath neck or jaw pain.  She describes having some palpitations when she lies on her side.  She denies any increased caffeine intake, illicit drug use or smoking.  No history of prior heart disease.    Home Medications Prior to Admission medications   Medication Sig Start Date End Date Taking? Authorizing Provider  escitalopram (LEXAPRO) 20 MG tablet Take 1 tablet (20 mg total) by mouth daily. Patient not taking: Reported on 02/06/2022 03/21/21   Cheral Marker, CNM      Allergies    Peanut-containing drug products    Review of Systems   Review of Systems  Constitutional:  Negative for chills and fever.  Respiratory:  Negative for chest tightness, shortness of breath and wheezing.   Cardiovascular:  Positive for chest pain and palpitations.  Gastrointestinal:  Negative for abdominal pain, nausea and vomiting.  Genitourinary:  Negative for dysuria and flank pain.  Musculoskeletal:  Negative for back pain and neck pain.  Skin:  Negative for rash.  Neurological:  Negative for dizziness, syncope, weakness and headaches.  Psychiatric/Behavioral:   Negative for confusion.     Physical Exam Updated Vital Signs BP 123/88 (BP Location: Right Arm)   Pulse 82   Temp 98.1 F (36.7 C) (Oral)   Resp 16   Ht 5\' 7"  (1.702 m)   Wt 59 kg   LMP 02/02/2022   SpO2 100%   BMI 20.36 kg/m  Physical Exam Vitals reviewed.  Constitutional:      General: She is not in acute distress.    Appearance: She is well-developed. She is not toxic-appearing.  HENT:     Mouth/Throat:     Mouth: Mucous membranes are moist.     Pharynx: Oropharynx is clear.  Cardiovascular:     Rate and Rhythm: Normal rate and regular rhythm.     Pulses: Normal pulses.  Pulmonary:     Effort: Pulmonary effort is normal.     Comments: Tenderness to palpation of the left lateral chest wall.  No crepitus. Chest:     Chest wall: Tenderness present.  Abdominal:     Palpations: Abdomen is soft.     Tenderness: There is no abdominal tenderness.  Musculoskeletal:     Cervical back: Normal range of motion.     Right lower leg: No edema.     Left lower leg: No edema.  Skin:    General: Skin is warm.     Capillary Refill: Capillary refill takes less than 2 seconds.     Findings: No erythema or rash.  Neurological:  General: No focal deficit present.     Mental Status: She is alert.     Sensory: No sensory deficit.     Motor: No weakness.     ED Results / Procedures / Treatments   Labs (all labs ordered are listed, but only abnormal results are displayed) Labs Reviewed  D-DIMER, QUANTITATIVE  CBC WITH DIFFERENTIAL/PLATELET  BASIC METABOLIC PANEL  POC URINE PREG, ED  TROPONIN I (HIGH SENSITIVITY)    EKG None  Radiology DG Chest 2 View  Result Date: 02/06/2022 CLINICAL DATA:  Chest pain. EXAM: CHEST - 2 VIEW COMPARISON:  None Available. FINDINGS: The heart size and mediastinal contours are within normal limits. Both lungs are clear. The visualized skeletal structures are unremarkable. IMPRESSION: No active cardiopulmonary disease. Electronically Signed    By: Lupita Raider M.D.   On: 02/06/2022 11:44    Procedures Procedures    Medications Ordered in ED Medications - No data to display  ED Course/ Medical Decision Making/ A&P                           Medical Decision Making Patient here for evaluation of left-sided chest wall pain.  Pain has been associated with some paresthesias of the left arm and hand that have been intermittent.  Her chest pain is exacerbated by lying on either side.  Pain last evening kept her from sleeping.  No history of prior heart disease, she denies any drug use or excessive caffeine intake.  No shortness of breath or history of PE/DVT  On exam, patient is well-appearing nontoxic.  She is resting comfortably talking with friend who is at bedside.  Vital signs are reassuring.  She is PERC negative.  Differential diagnosis would include pneumonia, pericarditis, chest wall pain, PE.  I suspect symptoms are musculoskeletal versus anxiety related.  Will obtain D-dimer for further evaluation of possible PE although I feel this is less likely.  Amount and/or Complexity of Data Reviewed Labs: ordered.    Details: Work-up today has been unremarkable.  Reassuring troponin and D-dimer.  Pregnancy test is negative. Radiology: ordered.    Details: Chest x-ray without evidence of active cardiopulmonary disease.  I have reviewed radiology results and agree with interpretation. ECG/medicine tests: ordered.    Details: EKG shows sinus rhythm with borderline short PR interval, ventricular rate of 70 Discussion of management or test interpretation with external provider(s): Patient had reassuring work-up today.  Doubt acute ischemic process, doubt PE.  Patient notes having palpitations at night while trying to rest.  This may be stress or anxiety related.  I feel that she is appropriate for discharge home at this time.  She will try symptomatic treatment first.  Follow-up information provided for cardiology as she may benefit from  Holter monitoring if symptoms or not improving.  Return precautions were discussed           Final Clinical Impression(s) / ED Diagnoses Final diagnoses:  Atypical chest pain  Palpitations    Rx / DC Orders ED Discharge Orders     None         Pauline Aus, PA-C 02/06/22 1231    Gloris Manchester, MD 02/07/22 [phone]

## 2022-02-06 NOTE — Discharge Instructions (Signed)
Your work-up today was reassuring.  I recommend that you try the ibuprofen as directed take with food.  You may apply warm heat on and off to your chest wall as well.  Avoid heavy lifting twisting or pulling for at least 1 week.  Call the cardiologist office listed to arrange a follow-up appointment in 1 to 2 weeks if your symptoms are not improving.  Return to the emergency department for any new or worsening symptoms.

## 2022-02-06 NOTE — ED Triage Notes (Signed)
Pt with left sided CP since last night, unable to get comfortable or sleep.  Pt with tingling to left hand that comes and goes. Denies SOB.  Some nausea.

## 2022-03-16 ENCOUNTER — Ambulatory Visit (INDEPENDENT_AMBULATORY_CARE_PROVIDER_SITE_OTHER): Payer: Medicaid Other | Admitting: *Deleted

## 2022-03-16 VITALS — BP 123/75 | HR 80 | Wt 131.0 lb

## 2022-03-16 DIAGNOSIS — N926 Irregular menstruation, unspecified: Secondary | ICD-10-CM

## 2022-03-16 DIAGNOSIS — Z3201 Encounter for pregnancy test, result positive: Secondary | ICD-10-CM

## 2022-03-16 LAB — POCT URINE PREGNANCY: Preg Test, Ur: POSITIVE — AB

## 2022-03-16 NOTE — Progress Notes (Signed)
   NURSE VISIT- PREGNANCY CONFIRMATION   SUBJECTIVE:  Sheila Griffith is a 26 y.o. [phone] female at [redacted]w[redacted]d by certain LMP of Patient's last menstrual period was 01/27/2022. Here for pregnancy confirmation.  Home pregnancy test: positive x 3   She reports no complaints.  She is taking prenatal vitamins.    OBJECTIVE:  BP 123/75 (BP Location: Right Arm, Patient Position: Sitting, Cuff Size: Normal)   Pulse 80   Wt 131 lb (59.4 kg)   LMP 01/27/2022   BMI 20.52 kg/m   Appears well, in no apparent distress  Results for orders placed or performed in visit on 03/16/22 (from the past 24 hour(s))  POCT urine pregnancy   Collection Time: 03/16/22 11:18 AM  Result Value Ref Range   Preg Test, Ur Positive (A) Negative    ASSESSMENT: Positive pregnancy test, [redacted]w[redacted]d by LMP    PLAN: Schedule for dating ultrasound in next available  Prenatal vitamins: continue   Nausea medicines: not currently needed   OB packet given: Yes  Jobe Marker  03/16/2022 11:19 AM

## 2022-03-24 ENCOUNTER — Other Ambulatory Visit: Payer: Self-pay | Admitting: Obstetrics & Gynecology

## 2022-03-24 DIAGNOSIS — O3680X0 Pregnancy with inconclusive fetal viability, not applicable or unspecified: Secondary | ICD-10-CM

## 2022-03-25 DIAGNOSIS — F419 Anxiety disorder, unspecified: Secondary | ICD-10-CM | POA: Diagnosis not present

## 2022-03-25 DIAGNOSIS — O26891 Other specified pregnancy related conditions, first trimester: Secondary | ICD-10-CM | POA: Diagnosis not present

## 2022-03-25 DIAGNOSIS — Z3A08 8 weeks gestation of pregnancy: Secondary | ICD-10-CM | POA: Diagnosis not present

## 2022-03-25 DIAGNOSIS — O99341 Other mental disorders complicating pregnancy, first trimester: Secondary | ICD-10-CM | POA: Diagnosis not present

## 2022-03-25 DIAGNOSIS — O99511 Diseases of the respiratory system complicating pregnancy, first trimester: Secondary | ICD-10-CM | POA: Diagnosis not present

## 2022-03-25 DIAGNOSIS — Z3A00 Weeks of gestation of pregnancy not specified: Secondary | ICD-10-CM | POA: Diagnosis not present

## 2022-03-25 DIAGNOSIS — O00101 Right tubal pregnancy without intrauterine pregnancy: Secondary | ICD-10-CM | POA: Diagnosis not present

## 2022-03-25 DIAGNOSIS — R1031 Right lower quadrant pain: Secondary | ICD-10-CM | POA: Diagnosis not present

## 2022-03-25 DIAGNOSIS — J45909 Unspecified asthma, uncomplicated: Secondary | ICD-10-CM | POA: Diagnosis not present

## 2022-03-25 DIAGNOSIS — Z79899 Other long term (current) drug therapy: Secondary | ICD-10-CM | POA: Diagnosis not present

## 2022-03-26 DIAGNOSIS — R1031 Right lower quadrant pain: Secondary | ICD-10-CM | POA: Diagnosis not present

## 2022-03-26 DIAGNOSIS — O00101 Right tubal pregnancy without intrauterine pregnancy: Secondary | ICD-10-CM | POA: Diagnosis not present

## 2022-03-26 DIAGNOSIS — O26891 Other specified pregnancy related conditions, first trimester: Secondary | ICD-10-CM | POA: Diagnosis not present

## 2022-03-27 ENCOUNTER — Other Ambulatory Visit: Payer: Medicaid Other

## 2022-03-28 ENCOUNTER — Other Ambulatory Visit: Payer: Medicaid Other

## 2022-04-08 ENCOUNTER — Other Ambulatory Visit: Payer: Self-pay | Admitting: Women's Health

## 2022-04-14 ENCOUNTER — Encounter: Payer: Self-pay | Admitting: Adult Health

## 2022-04-14 ENCOUNTER — Ambulatory Visit: Payer: Medicaid Other | Admitting: Adult Health

## 2022-04-14 ENCOUNTER — Other Ambulatory Visit (HOSPITAL_COMMUNITY)
Admission: RE | Admit: 2022-04-14 | Discharge: 2022-04-14 | Disposition: A | Discharge status: Home / Self Care | Payer: Medicaid Other | Source: Ambulatory Visit | Attending: Adult Health | Admitting: Adult Health

## 2022-04-14 VITALS — BP 110/74 | HR 74 | Ht 67.0 in | Wt 131.0 lb

## 2022-04-14 DIAGNOSIS — Z124 Encounter for screening for malignant neoplasm of cervix: Secondary | ICD-10-CM | POA: Insufficient documentation

## 2022-04-14 DIAGNOSIS — R102 Pelvic and perineal pain unspecified side: Secondary | ICD-10-CM | POA: Insufficient documentation

## 2022-04-14 DIAGNOSIS — Z3202 Encounter for pregnancy test, result negative: Secondary | ICD-10-CM

## 2022-04-14 DIAGNOSIS — Z302 Encounter for sterilization: Secondary | ICD-10-CM | POA: Insufficient documentation

## 2022-04-14 DIAGNOSIS — Z8759 Personal history of other complications of pregnancy, childbirth and the puerperium: Secondary | ICD-10-CM | POA: Diagnosis not present

## 2022-04-14 LAB — POCT URINALYSIS DIPSTICK
Blood, UA: NEGATIVE
Glucose, UA: NEGATIVE
Ketones, UA: NEGATIVE
Leukocytes, UA: NEGATIVE
Nitrite, UA: NEGATIVE
Protein, UA: NEGATIVE

## 2022-04-14 LAB — POCT URINE PREGNANCY: Preg Test, Ur: NEGATIVE

## 2022-04-14 NOTE — Progress Notes (Signed)
  Subjective:     Patient ID: Sheila Griffith, female   DOB: October 18, 1995, 26 y.o.   MRN: [id number]  HPI Esty is a 26 year old white female, married, [id number], in complaining of pelvic pressure, has discomfort with laughing, bladder full, and with walking. Denies any fever or bleeding. She was seen 03/25/[facility] at [facility] for pain and had US showing right 2.1 cm adnexal mass, ? Ectopic, QHCG was 642 and then 9/17 had dropped to 334, no MTX given she was sent home. Had some bleeding within 24 hours, no bleeding now. Blood type is O+. Has not had sex since.  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   Review of Systems +pelvic pressure No fever or bleeding No sex since ER visit Reviewed past medical,surgical, social and family history. Reviewed medications and allergies.     Objective:   Physical Exam BP 110/74 (BP Location: Left Arm, Patient Position: Sitting, Cuff Size: Normal)   Pulse 74   Ht 5\' 7"  (1.702 m)   Wt 131 lb (59.4 kg)   LMP 01/27/2022   Breastfeeding No   BMI 20.52 kg/m   UPT is negative.Urine dipstick was negative. Skin warm and dry.Pelvic: external genitalia is normal in appearance no lesions, vagina: pink and moist,urethra has no lesions or masses noted, cervix: bulbous,no CMT, pap with GC/CHL and HPV genotyping performed, uterus: normal size, shape and contour, non tender, no masses felt, adnexa: no masses, mild RLQ tenderness noted, no rebound. Bladder is non tender and no masses felt.   [place] at [facility]:  1. No intrauterine gestational sac.  2. 2.1 cm complex focal "mass" adjacent to the right ovary. Imaging  features highly concerning for ectopic pregnancy. No evidence for  intraperitoneal free fluid or hemoperitoneum.  Discussed with Dr Elonda Husky and he agrees with plan of rechecking [facility] and [place].   Upstream - 04/14/22 2353       Pregnancy Intention Screening   Does the patient want to become pregnant in the next year? No    Does  the patient's partner want to become pregnant in the next year? No    Would the patient like to discuss contraceptive options today? No      Contraception Wrap Up   Current Method Female Condom    End Method Female Condom            Examination chaperoned by Levy Pupa LPN  Assessment:     1. Pelvic pressure in female +pelvic pressure, has discomfort with laughing, bladder full, and with walking. Denies any fever or bleeding.  Urine is negative  Will check QHCG and CBC and get follow up US  - POCT Urinalysis Dipstick - Beta hCG quant (ref lab) - US PELVIC COMPLETE WITH TRANSVAGINAL; Future - CBC w/Diff Can take Tylenol or Advil if needed   2. Pregnancy examination or test, negative result  - POCT urine pregnancy - Beta hCG quant (ref lab)  3. 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 History of ectopic pregnancy Will check QHCG and [place] - Beta hCG quant (ref lab) - US PELVIC COMPLETE WITH TRANSVAGINAL; Future - CBC w/Diff     Plan:    If any increase in pain go to ER Follow up with me in 6 days

## 2022-04-15 LAB — CBC WITH DIFFERENTIAL/PLATELET
Basophils Absolute: 0 10*3/uL (ref 0.0–0.2)
Basos: 0 %
EOS (ABSOLUTE): 0.4 10*3/uL (ref 0.0–0.4)
Eos: 5 %
Hematocrit: 39.8 % (ref 34.0–46.6)
Hemoglobin: 13.2 g/dL (ref 11.1–15.9)
Immature Grans (Abs): 0 10*3/uL (ref 0.0–0.1)
Immature Granulocytes: 0 %
Lymphocytes Absolute: 1.8 10*3/uL (ref 0.7–3.1)
Lymphs: 20 %
MCH: 29.9 pg (ref 26.6–33.0)
MCHC: 33.2 g/dL (ref 31.5–35.7)
MCV: 90 fL (ref 79–97)
Monocytes Absolute: 0.6 10*3/uL (ref 0.1–0.9)
Monocytes: 7 %
Neutrophils Absolute: 6.2 10*3/uL (ref 1.4–7.0)
Neutrophils: 68 %
Platelets: 282 10*3/uL (ref 150–450)
RBC: 4.42 x10E6/uL (ref 3.77–5.28)
RDW: 13.4 % (ref 11.7–15.4)
WBC: 9.1 10*3/uL (ref 3.4–10.8)

## 2022-04-15 LAB — BETA HCG QUANT (REF LAB): hCG Quant: 4 m[IU]/mL

## 2022-04-18 LAB — CYTOLOGY - PAP
Chlamydia: NEGATIVE
Comment: NEGATIVE
Comment: NEGATIVE
Comment: NEGATIVE
Comment: NEGATIVE
Comment: NORMAL
HPV 16: NEGATIVE
HPV 18 / 45: NEGATIVE
High risk HPV: POSITIVE — AB
Neisseria Gonorrhea: NEGATIVE

## 2022-04-19 ENCOUNTER — Ambulatory Visit (HOSPITAL_COMMUNITY)
Admission: RE | Admit: 2022-04-19 | Discharge: 2022-04-19 | Disposition: A | Discharge status: Home / Self Care | Payer: Medicaid Other | Source: Ambulatory Visit | Attending: Adult Health | Admitting: Adult Health

## 2022-04-19 DIAGNOSIS — Z8759 Personal history of other complications of pregnancy, childbirth and the puerperium: Secondary | ICD-10-CM | POA: Diagnosis not present

## 2022-04-19 DIAGNOSIS — R102 Pelvic and perineal pain: Secondary | ICD-10-CM | POA: Insufficient documentation

## 2022-04-19 DIAGNOSIS — N83201 Unspecified ovarian cyst, right side: Secondary | ICD-10-CM | POA: Diagnosis not present

## 2022-04-20 ENCOUNTER — Ambulatory Visit: Payer: Medicaid Other | Admitting: Adult Health

## 2022-04-20 ENCOUNTER — Encounter: Payer: Self-pay | Admitting: Adult Health

## 2022-04-20 VITALS — BP 111/75 | HR 70 | Ht 67.5 in | Wt 130.0 lb

## 2022-04-20 DIAGNOSIS — N83201 Unspecified ovarian cyst, right side: Secondary | ICD-10-CM | POA: Diagnosis not present

## 2022-04-20 DIAGNOSIS — R102 Pelvic and perineal pain: Secondary | ICD-10-CM

## 2022-04-20 DIAGNOSIS — F418 Other specified anxiety disorders: Secondary | ICD-10-CM | POA: Diagnosis not present

## 2022-04-20 DIAGNOSIS — Z8759 Personal history of other complications of pregnancy, childbirth and the puerperium: Secondary | ICD-10-CM | POA: Diagnosis not present

## 2022-04-20 DIAGNOSIS — R87612 Low grade squamous intraepithelial lesion on cytologic smear of cervix (LGSIL): Secondary | ICD-10-CM | POA: Insufficient documentation

## 2022-04-20 HISTORY — DX: Low grade squamous intraepithelial lesion on cytologic smear of cervix (LGSIL): R87.612

## 2022-04-20 MED ORDER — HYDROXYZINE HCL 10 MG PO TABS: 10.0000 mg | ORAL_TABLET | Freq: Three times a day (TID) | ORAL | 2 refills | Status: AC | PRN

## 2022-04-20 NOTE — Progress Notes (Signed)
Subjective:     Patient ID: Sheila Griffith, female   DOB: [birth year], 26 y.o.   MRN: [id number]  HPI Sheila Griffith is a 26 year old white female, married, [id number] back in follow up on recent ectopic and pelvic pressure, and to review [place].  Last pap was 04/14/22, LSIL +HPV, repeat in 1 year  Review of Systems Still hs pelvic pressure Reviewed past medical,surgical, social and family history. Reviewed medications and allergies.     Objective:   Physical Exam BP 111/75 (BP Location: Left Arm, Patient Position: Sitting, Cuff Size: Normal)   Pulse 70   Ht 5' 7.5" (1.715 m)   Wt 130 lb (59 kg)   Breastfeeding No   BMI 20.06 kg/m     Skin warm and dry. Lungs: clear to ausculation bilaterally. Cardiovascular: regular rate and rhythm.   PO:EUMPNTIRWE: Small amount of nonspecific endometrial fluid.   Complicated cystic lesion of the RIGHT ovary 3.5 cm greatest size, question hemorrhagic cyst; follow-up ultrasound recommended in 6-12 weeks to reassess.   Remainder of exam unremarkable.    04/20/2022   10:53 AM 04/18/2021    8:57 AM 04/06/2021    8:47 AM  Depression screen PHQ 2/9  Decreased Interest 3 2 2   Down, Depressed, Hopeless 0 0 1  PHQ - 2 Score 3 2 3   Altered sleeping 3 0 0  Tired, decreased energy 3 3 3   Change in appetite 2 3 3   Feeling bad or failure about yourself  0 0 0  Trouble concentrating 2 3 0  Moving slowly or fidgety/restless 0 0 0  Suicidal thoughts 1 1 1   PHQ-9 Score 14 12 10        04/20/2022   10:54 AM 03/21/2021    9:38 AM  GAD 7 : Generalized Anxiety Score  Nervous, Anxious, on Edge 3 2  Control/stop worrying 3 3  Worry too much - different things 1 3  Trouble relaxing 1 3  Restless 1 3  Easily annoyed or irritable 3 3  Afraid - awful might happen 3 3  Total GAD 7 Score 15 20    Upstream - 04/20/22 0031       Contraception Wrap Up   End Method Female Condom    Contraception Counseling Provided No               Assessment:     1. Right  ovarian cyst Has 3.5 cm complicated right ovarian cyst on She declines birth control She is wanting a tubal ligation   2. Depression with anxiety She is taking lexapro 5 mg has weaned down form 20 mg, her preference Feels anxious will try atarax as needed and will help with sleep, can take 2  Meds ordered this encounter  Medications   hydrOXYzine (ATARAX) 10 MG tablet    Sig: Take 1 tablet (10 mg total) by mouth 3 (three) times daily as needed.    Dispense:  30 tablet    Refill:  2    Order Specific Question:   Supervising Provider    Answer:   , LUTHER H [[id number]]     3. Pelvic pressure in female Still has pressure performed 04/19/22 shows 3.5 cm right ovarian cyst Can alternate tylenol and advil as needed  Can repeat 05/21/2021 in about 12 weeks   4. History of ectopic pregnancy QHCG was 4 04/14/22  5. Low grade squamous intraepithelial lesion on cytologic smear of cervix (LGSIL) Repeat pap in 1 year per [facility] CIN  3+ 5 year risk is 6%     Plan:    Tubal papers signed today   Return in 4 week for  pre op with Dr Nelda Marseille for tubal

## 2022-05-19 ENCOUNTER — Encounter: Payer: Self-pay | Admitting: Obstetrics & Gynecology

## 2022-05-19 ENCOUNTER — Ambulatory Visit (INDEPENDENT_AMBULATORY_CARE_PROVIDER_SITE_OTHER): Payer: Medicaid Other | Admitting: Obstetrics & Gynecology

## 2022-05-19 VITALS — BP 118/77 | HR 76 | Ht 67.5 in | Wt 132.0 lb

## 2022-05-19 DIAGNOSIS — Z308 Encounter for other contraceptive management: Secondary | ICD-10-CM | POA: Diagnosis not present

## 2022-05-19 NOTE — Progress Notes (Signed)
   GYN VISIT Patient name: Sheila Griffith MRN [id number]  Date of birth: 05/17/1996 Chief Complaint:   Pre-op Exam (For tubal, consent signed with Valentina Lucks on 04/20/22)  History of Present Illness:   Sheila Griffith is a 26 y.o. [phone]  female being seen today for a preop appointment.  She desires permanent sterilization.  She does not want another pregnancy ever.  She is happy with her one child.  She notes h/o prior ectopic pregnancy and h/o ovarian cysts.  Additionally, noted considerable postpartum depression and does not want to go through that again.  Currently using condoms and has not been interested in any hormonal interventions.  Patient's last menstrual period was 04/30/2022.     04/20/2022   10:53 AM 04/18/2021    8:57 AM 04/06/2021    8:47 AM 03/21/2021    9:37 AM 03/16/2020   10:49 AM  Depression screen PHQ 2/9  Decreased Interest 3 2 2 1 2   Down, Depressed, Hopeless 0 0 1 1 1   PHQ - 2 Score 3 2 3 2 3   Altered sleeping 3 0 0 3 3  Tired, decreased energy 3 3 3 3 3   Change in appetite 2 3 3 3  0  Feeling bad or failure about yourself  0 0 0 1 1  Trouble concentrating 2 3 0 3 0  Moving slowly or fidgety/restless 0 0 0 0 0  Suicidal thoughts 1 1 1 1  0  PHQ-9 Score 14 12 10 16 10   Difficult doing work/chores     Somewhat difficult     Review of Systems:   Pertinent items are noted in HPI Denies fever/chills, dizziness, headaches, visual disturbances, fatigue, shortness of breath, chest pain, abdominal pain, vomiting, denies problems with periods, bowel movements, urination, or intercourse unless otherwise stated above.  Pertinent History Reviewed:  Reviewed past medical,surgical, social, obstetrical and family history.  Reviewed problem list, medications and allergies. Physical Assessment:   Vitals:   05/19/22 0859  BP: 118/77  Pulse: 76  Weight: 132 lb (59.9 kg)  Height: 5' 7.5" (1.715 m)  Body mass index is 20.37 kg/m.       Physical Examination:   General  appearance: alert, well appearing, and in no distress  Psych: mood appropriate, normal affect  Skin: warm & dry   Cardiovascular: normal heart rate noted  Respiratory: normal respiratory effort, no distress  Abdomen: soft, non-tender   Pelvic: examination not indicated  Extremities: no edema   Chaperone: N/A    Assessment & Plan:  1) Contraceptive management -reviewed permanent sterilization by laparoscopic bilateral salpingectomy -discussed risk/benefit including but not limited to risk of bleeding, infection and injury to surrounding organs -discussed alternative options including LARCs, OCPs and other options -reviewed that sterilization is permanent- not reversible -reviewed that she would still have regular menses and that ovarian cysts would may still occur -discussed recovery and time away from work- advised at least 2 wks -questions/concerns were addressed and she desires to proceed -surgical referral created   No orders of the defined types were placed in this encounter.   No follow-ups on file.   , DO Attending Obstetrician & Gynecologist, [facility]

## 2022-07-26 NOTE — Patient Instructions (Signed)
Sheila Griffith  07/26/2022     @PREFPERIOPPHARMACY @   Your procedure is scheduled on  08/01/2022.   Report to [facility] at  0600 A.M.   Call this number if you have problems the morning of surgery:  [phone]  If you experience any cold or flu symptoms such as cough, fever, chills, shortness of breath, etc. between now and your scheduled surgery, please notify us at the above number.   Remember:  Do not eat or drink after midnight.      Take these medicines the morning of surgery with A SIP OF WATER                              lexapro, hydroxyzine.     Do not wear jewelry, make-up or nail polish.  Do not wear lotions, powders, or perfumes, or deodorant.  Do not shave 48 hours prior to surgery.  Men may shave face and neck.  Do not bring valuables to the hospital.  [facility] is not responsible for any belongings or valuables.  Contacts, dentures or bridgework may not be worn into surgery.  Leave your suitcase in the car.  After surgery it may be brought to your room.  For patients admitted to the hospital, discharge time will be determined by your treatment team.  Patients discharged the day of surgery will not be allowed to drive home and must have someone with them for 24 hours.    Special instructions:   DO NOT smoke tobacco or vape for 24 hours before your procedure.  Please read over the following fact sheets that you were given. Pain Booklet, Coughing and Deep Breathing, Blood Transfusion Information, Surgical Site Infection Prevention, Anesthesia Post-op Instructions, and Care and Recovery After Surgery       Salpingectomy, Care After The following information offers guidance on how to care for yourself after your procedure. Your health care provider may also give you more specific instructions. If you have problems or questions, contact your health care provider. What can I expect after the procedure? After the procedure, it is common to  have: Pain in your abdomen. Light vaginal bleeding (spotting) for a few days. Tiredness. Your recovery time will depend on which method was used for your surgery. Follow these instructions at home: Medicines Take over-the-counter and prescription medicines only as told by your health care provider. Ask your health care provider if the medicine prescribed to you: Requires you to avoid driving or using machinery. Can cause constipation. You may need to take actions to prevent or treat constipation, such as: Drink enough fluid to keep your urine pale yellow. Take over-the-counter or prescription medicines. Eat foods that are high in fiber, such as beans, whole grains, and fresh fruits and vegetables. Limit foods that are high in fat and processed sugars, such as fried or sweet foods. Incision care  Follow instructions from your health care provider about how to take care of your incision or incisions. Make sure you: Wash your hands with soap and water for at least 20 seconds before and after you change your bandage (dressing). If soap and water are not available, use hand sanitizer. Change or remove your dressing as told by your health care provider. Leave stitches (sutures), skin glue, staples, or adhesive strips in place. These skin closures may need to stay in place for 2 weeks or longer. If adhesive strip edges start  to loosen and curl up, you may trim the loose edges. Do not remove adhesive strips completely unless your health care provider tells you to do that. Keep your dressing clean and dry. Check your incision area every day for signs of infection. Check for: Redness, swelling, or pain that gets worse. Fluid or blood. Warmth. Pus or a bad smell. Activity Rest as told by your health care provider. Avoid sitting for a long time without moving. Get up to take short walks every 1-2 hours. This is important to improve blood flow and breathing. Ask for help if you feel weak or  unsteady. Return to your normal activities as told by your health care provider. Ask your health care provider what activities are safe for you. Do not drive until your health care provider says that it is safe. Do not lift anything that is heavier than 10 lb (4.5 kg), or the limit that you are told, until your health care provider says that it is safe. This may last for 2-6 weeks depending on your surgery. Do not douche, use tampons, or have sex until your health care provider approves. General instructions Do not use any products that contain nicotine or tobacco. These products include cigarettes, chewing tobacco, and vaping devices, such as e-cigarettes. These can delay healing after surgery. If you need help quitting, ask your health care provider. Wear compression stockings as told by your health care provider. These stockings help to prevent blood clots and reduce swelling in your legs. Do not take baths, swim, or use a hot tub until your health care provider approves. You may take showers. Keep all follow-up visits. This is important. Contact a health care provider if: You have pain when you urinate. You have redness, swelling, or more pain around an incision or an incision feels warm to the touch. You have pus, fluid, blood, or a bad smell coming from an incision or an incision starts to open. You have a fever. You have abdominal pain that gets worse or does not get better with medicine. You have a rash. You feel light-headed, have nausea and vomiting, or both. Get help right away if: You have pain in your chest or leg. You develop shortness of breath. You faint. You have increased or heavy vaginal bleeding, such as soaking a sanitary napkin in an hour. These symptoms may represent a serious problem that is an emergency. Do not wait to see if the symptoms will go away. Get medical help right away. Call your local emergency services (911 in the [place] Do not drive yourself to the  hospital. Summary After the procedure, it is common to feel tired, have pain in your abdomen, and have light vaginal bleeding for a few days. Follow instructions from your health care provider about how to take care of your incision or incisions. Return to your normal activities as told by your health care provider. Ask your health care provider what activities are safe for you. Do not douche, use tampons, or have sex until your health care provider approves. Keep all follow-up visits. This is important. This information is not intended to replace advice given to you by your health care provider. Make sure you discuss any questions you have with your health care provider. Document Revised: 05/18/2020 Document Reviewed: 05/18/2020 Elsevier Patient Education  [place] Anesthesia, Adult, Care After The following information offers guidance on how to care for yourself after your procedure. Your health care provider may also give you more  specific instructions. If you have problems or questions, contact your health care provider. What can I expect after the procedure? After the procedure, it is common for people to: Have pain or discomfort at the IV site. Have nausea or vomiting. Have a sore throat or hoarseness. Have trouble concentrating. Feel cold or chills. Feel weak, sleepy, or tired (fatigue). Have soreness and body aches. These can affect parts of the body that were not involved in surgery. Follow these instructions at home: For the time period you were told by your health care provider:  Rest. Do not participate in activities where you could fall or become injured. Do not drive or use machinery. Do not drink alcohol. Do not take sleeping pills or medicines that cause drowsiness. Do not make important decisions or sign legal documents. Do not take care of children on your own. General instructions Drink enough fluid to keep your urine pale yellow. If you have  sleep apnea, surgery and certain medicines can increase your risk for breathing problems. Follow instructions from your health care provider about wearing your sleep device: Anytime you are sleeping, including during daytime naps. While taking prescription pain medicines, sleeping medicines, or medicines that make you drowsy. Return to your normal activities as told by your health care provider. Ask your health care provider what activities are safe for you. Take over-the-counter and prescription medicines only as told by your health care provider. Do not use any products that contain nicotine or tobacco. These products include cigarettes, chewing tobacco, and vaping devices, such as e-cigarettes. These can delay incision healing after surgery. If you need help quitting, ask your health care provider. Contact a health care provider if: You have nausea or vomiting that does not get better with medicine. You vomit every time you eat or drink. You have pain that does not get better with medicine. You cannot urinate or have bloody urine. You develop a skin rash. You have a fever. Get help right away if: You have trouble breathing. You have chest pain. You vomit blood. These symptoms may be an emergency. Get help right away. Call 911. Do not wait to see if the symptoms will go away. Do not drive yourself to the hospital. Summary After the procedure, it is common to have a sore throat, hoarseness, nausea, vomiting, or to feel weak, sleepy, or fatigue. For the time period you were told by your health care provider, do not drive or use machinery. Get help right away if you have difficulty breathing, have chest pain, or vomit blood. These symptoms may be an emergency. This information is not intended to replace advice given to you by your health care provider. Make sure you discuss any questions you have with your health care provider. Document Revised: 09/23/2021 Document Reviewed:  09/23/2021 Elsevier Patient Education  [place]. How to Use Chlorhexidine Before Surgery Chlorhexidine gluconate (CHG) is a germ-killing (antiseptic) solution that is used to clean the skin. It can get rid of the bacteria that normally live on the skin and can keep them away for about 24 hours. To clean your skin with CHG, you may be given: A CHG solution to use in the shower or as part of a sponge bath. A prepackaged cloth that contains CHG. Cleaning your skin with CHG may help lower the risk for infection: While you are staying in the intensive care unit of the hospital. If you have a vascular access, such as a central line, to provide short-term or long-term access  to your veins. If you have a catheter to drain urine from your bladder. If you are on a ventilator. A ventilator is a machine that helps you breathe by moving air in and out of your lungs. After surgery. What are the risks? Risks of using CHG include: A skin reaction. Hearing loss, if CHG gets in your ears and you have a perforated eardrum. Eye injury, if CHG gets in your eyes and is not rinsed out. The CHG product catching fire. Make sure that you avoid smoking and flames after applying CHG to your skin. Do not use CHG: If you have a chlorhexidine allergy or have previously reacted to chlorhexidine. On babies younger than 68 months of age. How to use CHG solution Use CHG only as told by your health care provider, and follow the instructions on the label. Use the full amount of CHG as directed. Usually, this is one bottle. During a shower Follow these steps when using CHG solution during a shower (unless your health care provider gives you different instructions): Start the shower. Use your normal soap and shampoo to wash your face and hair. Turn off the shower or move out of the shower stream. Pour the CHG onto a clean washcloth. Do not use any type of brush or rough-edged sponge. Starting at your neck, lather  your body down to your toes. Make sure you follow these instructions: If you will be having surgery, pay special attention to the part of your body where you will be having surgery. Scrub this area for at least 1 minute. Do not use CHG on your head or face. If the solution gets into your ears or eyes, rinse them well with water. Avoid your genital area. Avoid any areas of skin that have broken skin, cuts, or scrapes. Scrub your back and under your arms. Make sure to wash skin folds. Let the lather sit on your skin for 1-2 minutes or as long as told by your health care provider. Thoroughly rinse your entire body in the shower. Make sure that all body creases and crevices are rinsed well. Dry off with a clean towel. Do not put any substances on your body afterward--such as powder, lotion, or perfume--unless you are told to do so by your health care provider. Only use lotions that are recommended by the manufacturer. Put on clean clothes or pajamas. If it is the night before your surgery, sleep in clean sheets.  During a sponge bath Follow these steps when using CHG solution during a sponge bath (unless your health care provider gives you different instructions): Use your normal soap and shampoo to wash your face and hair. Pour the CHG onto a clean washcloth. Starting at your neck, lather your body down to your toes. Make sure you follow these instructions: If you will be having surgery, pay special attention to the part of your body where you will be having surgery. Scrub this area for at least 1 minute. Do not use CHG on your head or face. If the solution gets into your ears or eyes, rinse them well with water. Avoid your genital area. Avoid any areas of skin that have broken skin, cuts, or scrapes. Scrub your back and under your arms. Make sure to wash skin folds. Let the lather sit on your skin for 1-2 minutes or as long as told by your health care provider. Using a different clean, wet  washcloth, thoroughly rinse your entire body. Make sure that all body creases and crevices  are rinsed well. Dry off with a clean towel. Do not put any substances on your body afterward--such as powder, lotion, or perfume--unless you are told to do so by your health care provider. Only use lotions that are recommended by the manufacturer. Put on clean clothes or pajamas. If it is the night before your surgery, sleep in clean sheets. How to use CHG prepackaged cloths Only use CHG cloths as told by your health care provider, and follow the instructions on the label. Use the CHG cloth on clean, dry skin. Do not use the CHG cloth on your head or face unless your health care provider tells you to. When washing with the CHG cloth: Avoid your genital area. Avoid any areas of skin that have broken skin, cuts, or scrapes. Before surgery Follow these steps when using a CHG cloth to clean before surgery (unless your health care provider gives you different instructions): Using the CHG cloth, vigorously scrub the part of your body where you will be having surgery. Scrub using a back-and-forth motion for 3 minutes. The area on your body should be completely wet with CHG when you are done scrubbing. Do not rinse. Discard the cloth and let the area air-dry. Do not put any substances on the area afterward, such as powder, lotion, or perfume. Put on clean clothes or pajamas. If it is the night before your surgery, sleep in clean sheets.  For general bathing Follow these steps when using CHG cloths for general bathing (unless your health care provider gives you different instructions). Use a separate CHG cloth for each area of your body. Make sure you wash between any folds of skin and between your fingers and toes. Wash your body in the following order, switching to a new cloth after each step: The front of your neck, shoulders, and chest. Both of your arms, under your arms, and your hands. Your stomach and  groin area, avoiding the genitals. Your right leg and foot. Your left leg and foot. The back of your neck, your back, and your buttocks. Do not rinse. Discard the cloth and let the area air-dry. Do not put any substances on your body afterward--such as powder, lotion, or perfume--unless you are told to do so by your health care provider. Only use lotions that are recommended by the manufacturer. Put on clean clothes or pajamas. Contact a health care provider if: Your skin gets irritated after scrubbing. You have questions about using your solution or cloth. You swallow any chlorhexidine. Call your local poison control center (828-427-2790 in the [place] Get help right away if: Your eyes itch badly, or they become very red or swollen. Your skin itches badly and is red or swollen. Your hearing changes. You have trouble seeing. You have swelling or tingling in your mouth or throat. You have trouble breathing. These symptoms may represent a serious problem that is an emergency. Do not wait to see if the symptoms will go away. Get medical help right away. Call your local emergency services (911 in the [place] Do not drive yourself to the hospital. Summary Chlorhexidine gluconate (CHG) is a germ-killing (antiseptic) solution that is used to clean the skin. Cleaning your skin with CHG may help to lower your risk for infection. You may be given CHG to use for bathing. It may be in a bottle or in a prepackaged cloth to use on your skin. Carefully follow your health care provider's instructions and the instructions on the product label. Do not use CHG  if you have a chlorhexidine allergy. Contact your health care provider if your skin gets irritated after scrubbing. This information is not intended to replace advice given to you by your health care provider. Make sure you discuss any questions you have with your health care provider. Document Revised: 10/24/2021 Document Reviewed: 09/06/2020 Elsevier  Patient Education  [place].

## 2022-07-27 NOTE — H&P (Signed)
[facility] Attending History and Physical  Sheila Griffith is a 27 y.o. [phone] who presents for scheduled laparoscopic bilateral salpingectomy  In review, she desires permanent sterilization.  Alternatives were reviewed with patient and she declines.  Menses are regular and currently on her last day of menses.  No acute gyn concerns.  Denies any abnormal vaginal discharge, fevers, chills, sweats, dysuria, nausea, vomiting, other GI or GU symptoms or other general symptoms.  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   04/20/22 repeat in 1 year per ASCCP guidelines  5 year risk for CIN 3+ is 6%    Mental disorder    Past Surgical History:  Procedure Laterality Date   DENTAL SURGERY     miscarriage     x 2   OB History  Gravida Para Term Preterm AB Living  4 1 1   3 1   SAB IAB Ectopic Multiple Live Births  2   1 0 1    # Outcome Date GA Lbr Len/2nd Weight Sex Delivery Anes PTL Lv  4 Ectopic 03/2022          3 Term 10/11/19 [redacted]w[redacted]d 10:08 / 01:45 3390 g F Vag-Spont EPI  LIV  2 SAB 2018 [redacted]w[redacted]d         1 SAB 2016 [redacted]w[redacted]d         Patient denies any other pertinent gynecologic issues.  No current facility-administered medications on file prior to encounter.   Current Outpatient Medications on File Prior to Encounter  Medication Sig Dispense Refill   hydrOXYzine (ATARAX) 10 MG tablet Take 1 tablet (10 mg total) by mouth 3 (three) times daily as needed. 30 tablet 2   Multiple Vitamin (MULTIVITAMIN) tablet Take 1 tablet by mouth daily.     escitalopram (LEXAPRO) 20 MG tablet Take 1 tablet by mouth once daily (Patient taking differently: Take 10 mg by mouth daily.) 30 tablet 0   Allergies  Allergen Reactions   Peanut-Containing Drug Products Itching    Social History:   reports that she quit smoking about 8 years ago. Her smoking use included  cigarettes. She has never used smokeless tobacco. She reports that she does not currently use drugs. She reports that she does not drink alcohol. Family History  Problem Relation Age of Onset   Depression Mother    Anxiety disorder Mother    Arthritis Mother    Diabetes Father    Stroke Father    Heart disease Father    Depression Father    Anxiety disorder Father     Review of Systems: Pertinent items noted in HPI and remainder of comprehensive ROS otherwise negative.  PHYSICAL EXAM: Blood pressure 115/75, pulse 84, temperature 98 F (36.7 C), temperature source Oral, resp. rate 16, height 5\' 7"  (1.702 m), weight 60.8 kg, last menstrual period 07/28/2022, SpO2 100 %.   CONSTITUTIONAL: Well-developed, well-nourished female in no acute distress.  SKIN: Skin is warm and dry. No rash noted. Not diaphoretic. No erythema. No pallor. NEUROLOGIC: Alert and oriented to person, place, and time. Normal reflexes, muscle tone coordination.  PSYCHIATRIC: Normal mood and affect. Normal behavior. Normal judgment and thought content. CARDIOVASCULAR: Normal heart rate noted, regular rhythm RESPIRATORY: Effort and breath sounds normal, no problems with respiration noted ABDOMEN: Soft, nontender, nondistended. PELVIC: deferred MUSCULOSKELETAL: no calf tenderness  bilaterally EXT: no edema bilaterally, normal pulses  Labs:    Latest Ref Rng & Units 07/28/2022    9:12 AM 04/14/2022    9:25 AM 02/06/2022   11:23 AM  CBC  WBC 4.0 - 10.5 K/uL 7.0  9.1  7.9   Hemoglobin 12.0 - 15.0 g/dL 13.2  13.2  14.0   Hematocrit 36.0 - 46.0 % 39.0  39.8  41.0   Platelets 150 - 400 K/uL 235  282  230       Assessment: Desires sterilization  Plan: Laparoscopic bilateral salpingectomy -NPO -LR @ 125cc/hr -SCDs to OR -IV Toradol - Bilateral tubal ligation via salpingectomy reviewed with risk and benfit including but not limited to bleeding, infection, injury to other organs, irreversibility and failure rate  of 07/998. The absence of effect on future cycle, PMS and menopause also discussed. Questions answered and pt desires to proceed.   Janyth Pupa, DO Attending [place] [employer], [place]

## 2022-07-28 ENCOUNTER — Encounter (HOSPITAL_COMMUNITY): Payer: Self-pay

## 2022-07-28 ENCOUNTER — Encounter (HOSPITAL_COMMUNITY)
Admission: RE | Admit: 2022-07-28 | Discharge: 2022-07-28 | Disposition: A | Discharge status: Home / Self Care | Payer: Medicaid Other | Source: Ambulatory Visit | Attending: Obstetrics & Gynecology | Admitting: Obstetrics & Gynecology

## 2022-07-28 VITALS — BP 112/77 | HR 55 | Temp 98.5°F | Resp 16 | Ht 67.5 in | Wt 132.1 lb

## 2022-07-28 DIAGNOSIS — Z01818 Encounter for other preprocedural examination: Secondary | ICD-10-CM

## 2022-07-28 DIAGNOSIS — Z01812 Encounter for preprocedural laboratory examination: Secondary | ICD-10-CM | POA: Diagnosis not present

## 2022-07-28 DIAGNOSIS — Z302 Encounter for sterilization: Secondary | ICD-10-CM | POA: Insufficient documentation

## 2022-07-28 LAB — COMPREHENSIVE METABOLIC PANEL
ALT: 11 U/L (ref 0–44)
AST: 17 U/L (ref 15–41)
Albumin: 4.1 g/dL (ref 3.5–5.0)
Alkaline Phosphatase: 57 U/L (ref 38–126)
Anion gap: 6 (ref 5–15)
BUN: 12 mg/dL (ref 6–20)
CO2: 25 mmol/L (ref 22–32)
Calcium: 9 mg/dL (ref 8.9–10.3)
Chloride: 106 mmol/L (ref 98–111)
Creatinine, Ser: 0.84 mg/dL (ref 0.44–1.00)
GFR, Estimated: >60 mL/min (ref >60)
Glucose, Bld: 90 mg/dL (ref 70–99)
Potassium: 3.5 mmol/L (ref 3.5–5.1)
Sodium: 137 mmol/L (ref 135–145)
Total Bilirubin: 0.8 mg/dL (ref 0.3–1.2)
Total Protein: 7.1 g/dL (ref 6.5–8.1)

## 2022-07-28 LAB — CBC
HCT: 39 % (ref 36.0–46.0)
Hemoglobin: 13.2 g/dL (ref 12.0–15.0)
MCH: 30 pg (ref 26.0–34.0)
MCHC: 33.8 g/dL (ref 30.0–36.0)
MCV: 88.6 fL (ref 80.0–100.0)
Platelets: 235 10*3/uL (ref 150–400)
RBC: 4.4 MIL/uL (ref 3.87–5.11)
RDW: 12.9 % (ref 11.5–15.5)
WBC: 7 10*3/uL (ref 4.0–10.5)
nRBC: 0 % (ref 0.0–0.2)

## 2022-07-28 LAB — TYPE AND SCREEN
ABO/RH(D): O POS
Antibody Screen: NEGATIVE

## 2022-07-28 LAB — POCT PREGNANCY, URINE: Preg Test, Ur: NEGATIVE

## 2022-08-01 ENCOUNTER — Ambulatory Visit (HOSPITAL_COMMUNITY)
Admission: RE | Admit: 2022-08-01 | Discharge: 2022-08-01 | Disposition: A | Discharge status: Home / Self Care | Payer: Medicaid Other | Source: Ambulatory Visit | Attending: Obstetrics & Gynecology | Admitting: Obstetrics & Gynecology

## 2022-08-01 ENCOUNTER — Ambulatory Visit (HOSPITAL_COMMUNITY): Payer: Medicaid Other | Admitting: Anesthesiology

## 2022-08-01 ENCOUNTER — Other Ambulatory Visit: Payer: Self-pay

## 2022-08-01 ENCOUNTER — Encounter (HOSPITAL_COMMUNITY)
Admission: RE | Disposition: A | Discharge status: Home / Self Care | Payer: Self-pay | Source: Ambulatory Visit | Attending: Obstetrics & Gynecology

## 2022-08-01 ENCOUNTER — Encounter (HOSPITAL_COMMUNITY): Payer: Self-pay | Admitting: Obstetrics & Gynecology

## 2022-08-01 ENCOUNTER — Ambulatory Visit (HOSPITAL_BASED_OUTPATIENT_CLINIC_OR_DEPARTMENT_OTHER): Payer: Medicaid Other | Admitting: Anesthesiology

## 2022-08-01 DIAGNOSIS — Z302 Encounter for sterilization: Secondary | ICD-10-CM

## 2022-08-01 DIAGNOSIS — J45909 Unspecified asthma, uncomplicated: Secondary | ICD-10-CM | POA: Diagnosis not present

## 2022-08-01 DIAGNOSIS — Z87891 Personal history of nicotine dependence: Secondary | ICD-10-CM | POA: Diagnosis not present

## 2022-08-01 DIAGNOSIS — N838 Other noninflammatory disorders of ovary, fallopian tube and broad ligament: Secondary | ICD-10-CM | POA: Diagnosis not present

## 2022-08-01 HISTORY — PX: LAPAROSCOPIC BILATERAL SALPINGECTOMY: SHX5889

## 2022-08-01 LAB — ABO/RH: ABO/RH(D): O POS

## 2022-08-01 SURGERY — SALPINGECTOMY, BILATERAL, LAPAROSCOPIC
Anesthesia: General | Laterality: Bilateral

## 2022-08-01 MED ORDER — ROCURONIUM 10MG/ML (10ML) SYRINGE FOR MEDFUSION PUMP - OPTIME
INTRAVENOUS | Status: DC | PRN
  Administered 2022-08-01: 40 mg via INTRAVENOUS

## 2022-08-01 MED ORDER — PHENYLEPHRINE 80 MCG/ML (10ML) SYRINGE FOR IV PUSH (FOR BLOOD PRESSURE SUPPORT)
PREFILLED_SYRINGE | INTRAVENOUS | Status: AC
  Filled 2022-08-01: qty 10

## 2022-08-01 MED ORDER — POVIDONE-IODINE 10 % EX SWAB: 2.0000 | Freq: Once | CUTANEOUS | Status: DC

## 2022-08-01 MED ORDER — FENTANYL CITRATE (PF) 100 MCG/2ML IJ SOLN
INTRAMUSCULAR | Status: DC | PRN
  Administered 2022-08-01 (×2): 50 ug via INTRAVENOUS

## 2022-08-01 MED ORDER — IBUPROFEN 600 MG PO TABS: 600.0000 mg | ORAL_TABLET | Freq: Four times a day (QID) | ORAL | 0 refills | Status: AC | PRN

## 2022-08-01 MED ORDER — MIDAZOLAM HCL 2 MG/2ML IJ SOLN
INTRAMUSCULAR | Status: AC
  Filled 2022-08-01: qty 2

## 2022-08-01 MED ORDER — ACETAMINOPHEN 325 MG PO TABS: 650.0000 mg | ORAL_TABLET | Freq: Four times a day (QID) | ORAL | Status: AC | PRN

## 2022-08-01 MED ORDER — HYDROMORPHONE HCL 1 MG/ML IJ SOLN
0.2500 mg | INTRAMUSCULAR | Status: DC | PRN
  Administered 2022-08-01 (×2): 0.5 mg via INTRAVENOUS
  Filled 2022-08-01 (×2): qty 0.5

## 2022-08-01 MED ORDER — ONDANSETRON HCL 4 MG/2ML IJ SOLN
INTRAMUSCULAR | Status: DC | PRN
  Administered 2022-08-01: 4 mg via INTRAVENOUS

## 2022-08-01 MED ORDER — LIDOCAINE HCL (CARDIAC) PF 50 MG/5ML IV SOSY
PREFILLED_SYRINGE | INTRAVENOUS | Status: DC | PRN
  Administered 2022-08-01: 60 mg via INTRAVENOUS

## 2022-08-01 MED ORDER — ARTIFICIAL TEARS OPHTHALMIC OINT
TOPICAL_OINTMENT | OPHTHALMIC | Status: AC
  Filled 2022-08-01: qty 3.5

## 2022-08-01 MED ORDER — ONDANSETRON 4 MG PO TBDP: 4.0000 mg | ORAL_TABLET | Freq: Three times a day (TID) | ORAL | 0 refills | Status: AC | PRN

## 2022-08-01 MED ORDER — MEPERIDINE HCL 50 MG/ML IJ SOLN: 6.2500 mg | INTRAMUSCULAR | Status: DC | PRN

## 2022-08-01 MED ORDER — LIDOCAINE HCL (PF) 2 % IJ SOLN
INTRAMUSCULAR | Status: AC
  Filled 2022-08-01: qty 5

## 2022-08-01 MED ORDER — OXYCODONE HCL 5 MG PO TABS: 5.0000 mg | ORAL_TABLET | Freq: Four times a day (QID) | ORAL | 0 refills | Status: AC | PRN

## 2022-08-01 MED ORDER — PROPOFOL 10 MG/ML IV BOLUS
INTRAVENOUS | Status: AC
  Filled 2022-08-01: qty 20

## 2022-08-01 MED ORDER — HEMOSTATIC AGENTS (NO CHARGE) OPTIME
TOPICAL | Status: DC | PRN
  Administered 2022-08-01: 1 via TOPICAL

## 2022-08-01 MED ORDER — DEXAMETHASONE SODIUM PHOSPHATE 10 MG/ML IJ SOLN
INTRAMUSCULAR | Status: DC | PRN
  Administered 2022-08-01: 8 mg via INTRAVENOUS

## 2022-08-01 MED ORDER — PROPOFOL 10 MG/ML IV BOLUS
INTRAVENOUS | Status: DC | PRN
  Administered 2022-08-01: 150 mg via INTRAVENOUS

## 2022-08-01 MED ORDER — ONDANSETRON HCL 4 MG/2ML IJ SOLN
4.0000 mg | Freq: Once | INTRAMUSCULAR | Status: AC | PRN
  Administered 2022-08-01: 4 mg via INTRAVENOUS
  Filled 2022-08-01: qty 2

## 2022-08-01 MED ORDER — DOCUSATE SODIUM 100 MG PO CAPS: 100.0000 mg | ORAL_CAPSULE | Freq: Two times a day (BID) | ORAL | 0 refills | Status: DC

## 2022-08-01 MED ORDER — ORAL CARE MOUTH RINSE: 15.0000 mL | Freq: Once | OROMUCOSAL | Status: AC

## 2022-08-01 MED ORDER — KETOROLAC TROMETHAMINE 15 MG/ML IJ SOLN
15.0000 mg | INTRAMUSCULAR | Status: AC
  Administered 2022-08-01: 15 mg via INTRAVENOUS
  Filled 2022-08-01: qty 1

## 2022-08-01 MED ORDER — FENTANYL CITRATE (PF) 100 MCG/2ML IJ SOLN
INTRAMUSCULAR | Status: AC
  Filled 2022-08-01: qty 2

## 2022-08-01 MED ORDER — DEXAMETHASONE SODIUM PHOSPHATE 10 MG/ML IJ SOLN
INTRAMUSCULAR | Status: AC
  Filled 2022-08-01: qty 1

## 2022-08-01 MED ORDER — ONDANSETRON HCL 4 MG/2ML IJ SOLN
INTRAMUSCULAR | Status: AC
  Filled 2022-08-01: qty 2

## 2022-08-01 MED ORDER — BUPIVACAINE HCL (PF) 0.25 % IJ SOLN
INTRAMUSCULAR | Status: DC | PRN
  Administered 2022-08-01: 30 mL

## 2022-08-01 MED ORDER — BUPIVACAINE HCL (PF) 0.25 % IJ SOLN
INTRAMUSCULAR | Status: AC
  Filled 2022-08-01: qty 30

## 2022-08-01 MED ORDER — SODIUM CHLORIDE 0.9 % IR SOLN
Status: DC | PRN
  Administered 2022-08-01: 1000 mL

## 2022-08-01 MED ORDER — LACTATED RINGERS IV SOLN: INTRAVENOUS | Status: DC

## 2022-08-01 MED ORDER — MIDAZOLAM HCL 2 MG/2ML IJ SOLN
2.0000 mg | Freq: Once | INTRAMUSCULAR | Status: AC
  Administered 2022-08-01: 1 mg via INTRAVENOUS

## 2022-08-01 MED ORDER — CHLORHEXIDINE GLUCONATE 0.12 % MT SOLN
15.0000 mL | Freq: Once | OROMUCOSAL | Status: AC
  Administered 2022-08-01: 15 mL via OROMUCOSAL

## 2022-08-01 SURGICAL SUPPLY — 43 items
ADH SKN CLS APL DERMABOND .7 (GAUZE/BANDAGES/DRESSINGS) ×1
APL ESCP 73.6OZ SRGCL (TIP) ×1
APL SWBSTK 6 STRL LF DISP (MISCELLANEOUS) ×1
APPLICATOR COTTON TIP 6 STRL (MISCELLANEOUS) IMPLANT
APPLICATOR COTTON TIP 6IN STRL (MISCELLANEOUS) ×1
BLADE SURG SZ11 CARB STEEL (BLADE) ×1 IMPLANT
CLOTH BEACON ORANGE TIMEOUT ST (SAFETY) ×1 IMPLANT
COVER LIGHT HANDLE STERIS (MISCELLANEOUS) ×2 IMPLANT
DERMABOND ADVANCED .7 DNX12 (GAUZE/BANDAGES/DRESSINGS) ×1 IMPLANT
DILATOR CANAL MILEX (MISCELLANEOUS) IMPLANT
DRSG TELFA 3X8 NADH STRL (GAUZE/BANDAGES/DRESSINGS) IMPLANT
DURAPREP 26ML APPLICATOR (WOUND CARE) ×1 IMPLANT
GAUZE 4X4 16PLY ~~LOC~~+RFID DBL (SPONGE) ×2 IMPLANT
GLOVE BIO SURGEON STRL SZ 6.5 (GLOVE) ×1 IMPLANT
GLOVE BIOGEL PI IND STRL 7.0 (GLOVE) ×3 IMPLANT
GOWN STRL REUS W/ TWL LRG LVL3 (GOWN DISPOSABLE) ×2 IMPLANT
GOWN STRL REUS W/TWL LRG LVL3 (GOWN DISPOSABLE) ×2
KIT TURNOVER CYSTO (KITS) ×1 IMPLANT
KIT TURNOVER KIT A (KITS) ×1 IMPLANT
NDL HYPO 25X1 1.5 SAFETY (NEEDLE) ×1 IMPLANT
NDL INSUFFLATION 14GA 120MM (NEEDLE) ×1 IMPLANT
NEEDLE HYPO 25X1 1.5 SAFETY (NEEDLE) ×1 IMPLANT
NEEDLE INSUFFLATION 14GA 120MM (NEEDLE) ×1 IMPLANT
NS IRRIG 1000ML POUR BTL (IV SOLUTION) ×1 IMPLANT
PACK PERI GYN (CUSTOM PROCEDURE TRAY) ×1 IMPLANT
PAD ARMBOARD 7.5X6 YLW CONV (MISCELLANEOUS) ×1 IMPLANT
POWDER SURGICEL 3.0 GRAM (HEMOSTASIS) IMPLANT
SET BASIN LINEN APH (SET/KITS/TRAYS/PACK) ×1 IMPLANT
SET TUBE SMOKE EVAC HIGH FLOW (TUBING) ×1 IMPLANT
SHEARS HARMONIC ACE PLUS 36CM (ENDOMECHANICALS) ×1 IMPLANT
SLEEVE Z-THREAD 5X100MM (TROCAR) ×1 IMPLANT
SOL ANTI FOG 6CC (MISCELLANEOUS) ×1 IMPLANT
SUT MON AB 4-0 PS1 27 (SUTURE) ×1 IMPLANT
SUT VICRYL 0 UR6 27IN ABS (SUTURE) IMPLANT
SYR 10ML LL (SYRINGE) ×1 IMPLANT
SYR CONTROL 10ML LL (SYRINGE) ×1 IMPLANT
TIP ENDOSCOPIC SURGICEL (TIP) IMPLANT
TRAY FOLEY W/BAG SLVR 16FR (SET/KITS/TRAYS/PACK) ×1
TRAY FOLEY W/BAG SLVR 16FR ST (SET/KITS/TRAYS/PACK) ×1 IMPLANT
TROCAR KII 8X100ML NONTHREADED (TROCAR) ×1 IMPLANT
TROCAR Z-THREAD FIOS 5X100MM (TROCAR) ×1 IMPLANT
TUBING IRRIGATION (MISCELLANEOUS) IMPLANT
WARMER LAPAROSCOPE (MISCELLANEOUS) ×1 IMPLANT

## 2022-08-01 NOTE — Anesthesia Preprocedure Evaluation (Signed)
Anesthesia Evaluation  Patient identified by MRN, date of birth, ID band Patient awake    Reviewed: Allergy & Precautions, H&P , NPO status , Patient's Chart, lab work & pertinent test results  Airway Mallampati: II  TM Distance: >3 FB Neck ROM: Full    Dental  (+) Dental Advisory Given, Teeth Intact   Pulmonary asthma , former smoker   Pulmonary exam normal breath sounds clear to auscultation       Cardiovascular negative cardio ROS Normal cardiovascular exam Rhythm:Regular Rate:Normal     Neuro/Psych  PSYCHIATRIC DISORDERS Anxiety Depression    negative neurological ROS     GI/Hepatic negative GI ROS, Neg liver ROS,,,  Endo/Other  negative endocrine ROS    Renal/GU negative Renal ROS  negative genitourinary   Musculoskeletal negative musculoskeletal ROS (+)    Abdominal   Peds negative pediatric ROS (+)  Hematology negative hematology ROS (+)   Anesthesia Other Findings   Reproductive/Obstetrics negative OB ROS                             Anesthesia Physical Anesthesia Plan  ASA: 2  Anesthesia Plan: General   Post-op Pain Management: Dilaudid IV   Induction: Intravenous  PONV Risk Score and Plan: 4 or greater and Ondansetron, Dexamethasone and Midazolam  Airway Management Planned: Oral ETT  Additional Equipment:   Intra-op Plan:   Post-operative Plan: Extubation in OR  Informed Consent: I have reviewed the patients History and Physical, chart, labs and discussed the procedure including the risks, benefits and alternatives for the proposed anesthesia with the patient or authorized representative who has indicated his/her understanding and acceptance.     Dental advisory given  Plan Discussed with: CRNA and Surgeon  Anesthesia Plan Comments:         Anesthesia Quick Evaluation

## 2022-08-01 NOTE — Anesthesia Postprocedure Evaluation (Signed)
Anesthesia Post Note  Patient: Sheila Griffith  Procedure(s) Performed: LAPAROSCOPIC BILATERAL SALPINGECTOMY (Bilateral)  Patient location during evaluation: Phase II Anesthesia Type: General Level of consciousness: awake and alert and oriented Pain management: pain level controlled Vital Signs Assessment: post-procedure vital signs reviewed and stable Respiratory status: spontaneous breathing, nonlabored ventilation and respiratory function stable Cardiovascular status: blood pressure returned to baseline and stable Postop Assessment: no apparent nausea or vomiting Anesthetic complications: no  No notable events documented.   Last Vitals:  Vitals:   08/01/22 0915 08/01/22 0930  BP: 115/75 121/79  Pulse: 94 89  Resp: 12 13  Temp:  37.1 C  SpO2: 97% 100%    Last Pain:  Vitals:   08/01/22 0945  TempSrc:   PainSc: 4                  Tayra Dawe C Cam Dauphin

## 2022-08-01 NOTE — Discharge Instructions (Addendum)
HOME INSTRUCTIONS  Please note any unusual or excessive bleeding, pain, swelling. Mild dizziness or drowsiness are normal for about 24 hours after surgery.   Shower when comfortable  Restrictions: No driving for 24 hours or while taking pain medications.  Activity:  No heavy lifting (> 10 lbs), nothing in vagina (no tampons, douching, or intercourse) x 4 weeks; no tub baths for 4 weeks Vaginal spotting is expected but if your bleeding is heavy, period like,  please call the office   Diet:  You may return to your regular diet.  Do not eat large meals.  Eat small frequent meals throughout the day.  Continue to drink a good amount of water at least 6-8 glasses of water per day, hydration is very important for the healing process.  Pain Management: Take over the counter tylenol or ibuprofen as needed for pain.  You can either take one or alternate between the two medications for pain management.  You may also use a heating pack as needed.  For breakthrough or severe pain, you may also take oxycodone.  This medication works best when taken with the tylenol.   Oxycodone causes constipation, be sure to take a stool softener twice daily while taking this medication.  Alcohol -- Avoid for 24 hours and while taking pain medications.  Nausea: Take sips of ginger ale or soda  Fever -- Call physician if temperature over 101 degrees  Follow up:  If you do not already have a follow up appointment scheduled, please call the office at [phone].  If you experience fever (a temperature greater than 100.4), pain unrelieved by pain medication, shortness of breath, swelling of a single leg, or any other symptoms which are concerning to you please the office immediately.

## 2022-08-01 NOTE — Transfer of Care (Signed)
Immediate Anesthesia Transfer of Care Note  Patient: Sheila Griffith  Procedure(s) Performed: LAPAROSCOPIC BILATERAL SALPINGECTOMY (Bilateral)  Patient Location: PACU  Anesthesia Type:General  Level of Consciousness: awake  Airway & Oxygen Therapy: Patient Spontanous Breathing  Post-op Assessment: Report given to RN and Post -op Vital signs reviewed and stable  Post vital signs: Reviewed and stable  Last Vitals:  Vitals Value Taken Time  BP 109/66 08/01/22 0847  Temp 97.5   Pulse 93 08/01/22 0849  Resp 18 08/01/22 0849  SpO2 99 % 08/01/22 0849  Vitals shown include unvalidated device data.  Last Pain:  Vitals:   08/01/22 0625  TempSrc: Oral  PainSc: 0-No pain         Complications: No notable events documented.

## 2022-08-01 NOTE — Anesthesia Procedure Notes (Signed)
Procedure Name: Intubation Date/Time: 08/01/2022 7:41 AM  Performed by: Ollen Bowl, CRNAPre-anesthesia Checklist: Patient identified, Patient being monitored, Timeout performed, Emergency Drugs available and Suction available Patient Re-evaluated:Patient Re-evaluated prior to induction Oxygen Delivery Method: Circle System Utilized Preoxygenation: Pre-oxygenation with 100% oxygen Induction Type: IV induction Ventilation: Mask ventilation without difficulty Laryngoscope Size: Mac and 3 Grade View: Grade I Tube type: Oral Tube size: 7.0 mm Number of attempts: 1 Airway Equipment and Method: stylet Placement Confirmation: ETT inserted through vocal cords under direct vision, positive ETCO2 and breath sounds checked- equal and bilateral Secured at: 21 cm Tube secured with: Tape Dental Injury: Teeth and Oropharynx as per pre-operative assessment

## 2022-08-01 NOTE — Op Note (Signed)
PREOPERATIVE DIAGNOSIS:  Desires sterilization POSTOPERATIVE DIAGNOSIS: same PROCEDURE PERFORMED: Laparoscopic bilateral salpingectomy, pelvic mass sampling (para-ovarian) SURGEON: Dr. Janyth Pupa ANESTHESIA: General endotracheal.  ESTIMATED BLOOD LOSS: 10cc.  URINE OUTPUT: 75cc of clear yellow urine at the end of the procedure.  IV FLUIDS: 1000 cc of crystalloid.  SPECIMEN(S): bilateral fallopian tubes, pelvic (para-uterine) mass COMPLICATIONS: None.  CONDITION: Stable.   FINDINGS: No ascites or peritoneal studding was appreciated.  Bowel appeared grossly normal, minimal adhesions in posterior cul-de-sac.  Near right ovary in the broad ligament a ~2cm dark purple/brown soft mass noted.  Uterus normal size and shape.  Ovaries and tubes were normal in appearance.     Informed consent was obtained from the patient prior to taking her to the operating room where anesthesia was found to be adequate. She was placed in dorsal lithotomy position and examined under anesthesia. She was prepped and draped in normal sterile fashion. The bladder was catheterized with a foley under sterile technique.  A bi-valve speculum was then placed and the anterior lip of the cervix was grasped with the single tooth tenaculum. The hulka uterine manipulator was then advanced into the uterus to provide uterine mobility. The speculum and tenaculum were then removed.  Attention was then turned to the patients abdomen, 10cc of 0.25% Lidocained was inserted.  A 8 mm infraumbilical skin incision was made with the scalpel. The veress needle was carefully introduced into the peritoneal cavity while tenting the abdominal wall. Intraperitoneal placement was confirmed by use of a saline-drop test.  The gas was connected and confirmed intrabdominal placement by a low initial pressure of 43mmHg. The abdomen was then insuflated with CO2 gas. The trocar and sleeve were then advanced without difficulty into the abdomen under direct  visualization. Intraabdominal placement was confirmed by the laparoscope and surveillance of the abdomen was performed. Grossly normal appearing abdomen as mentioned in findings above.  Two additional ports were placed.  Each area was injected with 10cc of local. A 20mm trocar on her left lower quadrant and right lower quadrent.  Each trocar was placed under direct visualization.      The left fallopian tube was placed on tension and using the Harmonic, the tube was excised and removed through the port under direct visualization.  A similar procedure was performed on the right with removal of the fallopian tube.  Attention was turned to the para-ovarian mass,which appeared stuck within the broad ligament.  Graspers were used to take a small sampling as the mass was densely adherent to the broad ligament.  Minimal bleeding noted and hemostasis was obtained using the Harmonic.  Arista was placed.  Excellent hemostasis was noted.  The instruments were then removed from the patients abdomen with air allowed to fully escape. The left port site was closed with monocryl and all sites were closed with dermabond.  The manipulator was removed from the cervix with no lacerations or bleeding identified. The patient tolerated the procedure well with all sponge, lap, and needle counts correct. The patient was taken to recovery in stable condition. Foley was removed at the end of the procedure.  Janyth Pupa, DO Attending [place], [facility] for [employer], [place]

## 2022-08-02 LAB — SURGICAL PATHOLOGY

## 2022-08-03 ENCOUNTER — Encounter: Payer: Self-pay | Admitting: Obstetrics & Gynecology

## 2022-08-07 ENCOUNTER — Encounter (HOSPITAL_COMMUNITY): Payer: Self-pay | Admitting: Obstetrics & Gynecology

## 2022-08-09 ENCOUNTER — Encounter: Payer: Self-pay | Admitting: Obstetrics & Gynecology

## 2022-08-09 ENCOUNTER — Ambulatory Visit (INDEPENDENT_AMBULATORY_CARE_PROVIDER_SITE_OTHER): Payer: Medicaid Other | Admitting: Obstetrics & Gynecology

## 2022-08-09 VITALS — BP 111/71 | HR 80 | Ht 67.0 in | Wt 133.8 lb

## 2022-08-09 DIAGNOSIS — Z4889 Encounter for other specified surgical aftercare: Secondary | ICD-10-CM

## 2022-08-09 NOTE — Progress Notes (Signed)
    PostOp Visit Note  Sheila Griffith is a 27 y.o. [phone] female who presents for a postoperative visit. She is 1 week postop following a bilateral salpingectomy completed on 1/23   Today she notes that she is doing great Denies fever or chills.  Tolerating gen diet.  +Flatus, Regular BMs.  Pain is minimal and has returned to work. Overall doing well and reports no acute complaints   Review of Systems Pertinent items are noted in HPI.    Objective:  BP 111/71 (BP Location: Right Arm, Patient Position: Sitting, Cuff Size: Normal)   Pulse 80   Ht 5\' 7"  (1.702 m)   Wt 133 lb 12.8 oz (60.7 kg)   LMP 07/28/2022   BMI 20.96 kg/m    Physical Examination:  GENERAL ASSESSMENT: well developed and well nourished SKIN: normal color, no lesions CHEST: normal air exchange, respiratory effort normal with no retractions HEART: regular rate and rhythm ABDOMEN: soft, non-distended, no rebound, no guarding INCISION: well healed- C/D/I with dermabond EXTREMITY: no edema, no calf tenderness bilaterally PSYCH: mood appropriate, normal affect       Assessment:    S/p bilateral salpingectomy Stage 1 endometriosis   Plan:   -meeting postop milestones appropriately -reviewed path findings of endometriosis- reviewed diagnosis, symptoms and management -discussed NSAIDs and first step would be OCPs -menses are ok, notes occasional dyspareunia -desires holistic approach- encouraged pt to do some research regarding diet and endometriosis and started to make some suggestions -questions/concerns were addressed  Janyth Pupa, DO Attending [place], [place] for [employer], [place]

## 2023-02-02 DIAGNOSIS — R102 Pelvic and perineal pain: Secondary | ICD-10-CM | POA: Diagnosis not present

## 2023-02-02 DIAGNOSIS — G8929 Other chronic pain: Secondary | ICD-10-CM | POA: Diagnosis not present

## 2023-02-12 DIAGNOSIS — R102 Pelvic and perineal pain: Secondary | ICD-10-CM | POA: Diagnosis not present

## 2023-02-12 DIAGNOSIS — Z9079 Acquired absence of other genital organ(s): Secondary | ICD-10-CM | POA: Diagnosis not present

## 2023-02-12 DIAGNOSIS — Z8742 Personal history of other diseases of the female genital tract: Secondary | ICD-10-CM | POA: Diagnosis not present

## 2023-02-12 DIAGNOSIS — G8929 Other chronic pain: Secondary | ICD-10-CM | POA: Diagnosis not present

## 2023-02-12 DIAGNOSIS — N809 Endometriosis, unspecified: Secondary | ICD-10-CM | POA: Diagnosis not present

## 2023-04-03 DIAGNOSIS — Z01419 Encounter for gynecological examination (general) (routine) without abnormal findings: Secondary | ICD-10-CM | POA: Diagnosis not present

## 2023-04-03 DIAGNOSIS — R102 Pelvic and perineal pain: Secondary | ICD-10-CM | POA: Diagnosis not present

## 2023-04-03 DIAGNOSIS — G8929 Other chronic pain: Secondary | ICD-10-CM | POA: Diagnosis not present

## 2023-04-03 DIAGNOSIS — Z113 Encounter for screening for infections with a predominantly sexual mode of transmission: Secondary | ICD-10-CM | POA: Diagnosis not present

## 2023-04-03 DIAGNOSIS — Z0000 Encounter for general adult medical examination without abnormal findings: Secondary | ICD-10-CM | POA: Diagnosis not present
